# Patient Record
Sex: Female | Born: 1955 | Race: White | Hispanic: No | Smoking: Current every day smoker
Health system: Southern US, Community
[De-identification: ages and names within clinical notes are randomized; demographics above are authoritative.]

## PROBLEM LIST (undated history)

## (undated) DIAGNOSIS — R519 Headache, unspecified: Secondary | ICD-10-CM

## (undated) DIAGNOSIS — K219 Gastro-esophageal reflux disease without esophagitis: Secondary | ICD-10-CM

## (undated) DIAGNOSIS — J449 Chronic obstructive pulmonary disease, unspecified: Secondary | ICD-10-CM

## (undated) DIAGNOSIS — I10 Essential (primary) hypertension: Secondary | ICD-10-CM

## (undated) DIAGNOSIS — R51 Headache: Secondary | ICD-10-CM

## (undated) HISTORY — PX: FOOT SURGERY: SHX648

## (undated) HISTORY — PX: ABDOMINAL HYSTERECTOMY: SHX81

## (undated) HISTORY — PX: APPENDECTOMY: SHX54

## (undated) HISTORY — PX: OOPHORECTOMY: SHX86

## (undated) HISTORY — PX: TUBAL LIGATION: SHX77

## (undated) HISTORY — PX: BREAST SURGERY: SHX581

## (undated) HISTORY — PX: KNEE ARTHROSCOPY: SUR90

## (undated) HISTORY — PX: BREAST CYST EXCISION: SHX579

---

## 2004-02-12 ENCOUNTER — Emergency Department: Payer: Self-pay | Admitting: General Practice

## 2005-02-21 ENCOUNTER — Ambulatory Visit: Payer: Self-pay | Admitting: Gastroenterology

## 2005-05-15 ENCOUNTER — Ambulatory Visit: Payer: Self-pay | Admitting: Internal Medicine

## 2005-05-19 ENCOUNTER — Ambulatory Visit: Payer: Self-pay | Admitting: Internal Medicine

## 2005-07-20 ENCOUNTER — Ambulatory Visit: Payer: Self-pay | Admitting: Specialist

## 2005-09-30 ENCOUNTER — Emergency Department: Payer: Self-pay

## 2005-12-06 ENCOUNTER — Ambulatory Visit: Payer: Self-pay | Admitting: Internal Medicine

## 2006-02-15 DIAGNOSIS — I10 Essential (primary) hypertension: Secondary | ICD-10-CM | POA: Insufficient documentation

## 2006-02-15 DIAGNOSIS — K2100 Gastro-esophageal reflux disease with esophagitis, without bleeding: Secondary | ICD-10-CM | POA: Insufficient documentation

## 2006-04-18 ENCOUNTER — Ambulatory Visit: Payer: Self-pay | Admitting: Gastroenterology

## 2006-11-07 ENCOUNTER — Ambulatory Visit: Payer: Self-pay | Admitting: Family Medicine

## 2007-05-08 ENCOUNTER — Ambulatory Visit: Payer: Self-pay | Admitting: Gastroenterology

## 2007-12-31 ENCOUNTER — Emergency Department: Payer: Self-pay | Admitting: Emergency Medicine

## 2008-01-16 ENCOUNTER — Ambulatory Visit: Payer: Self-pay | Admitting: Internal Medicine

## 2008-06-11 ENCOUNTER — Ambulatory Visit: Payer: Self-pay | Admitting: Internal Medicine

## 2008-07-22 ENCOUNTER — Ambulatory Visit: Payer: Self-pay | Admitting: Gastroenterology

## 2008-09-25 ENCOUNTER — Ambulatory Visit: Payer: Self-pay | Admitting: Internal Medicine

## 2008-10-13 ENCOUNTER — Ambulatory Visit (HOSPITAL_COMMUNITY): Admission: RE | Admit: 2008-10-13 | Discharge: 2008-10-13 | Payer: Self-pay | Admitting: Neurosurgery

## 2008-10-13 ENCOUNTER — Encounter: Admission: RE | Admit: 2008-10-13 | Discharge: 2008-10-13 | Payer: Self-pay | Admitting: Neurosurgery

## 2009-01-08 ENCOUNTER — Emergency Department: Payer: Self-pay | Admitting: Emergency Medicine

## 2009-01-11 ENCOUNTER — Encounter: Admission: RE | Admit: 2009-01-11 | Discharge: 2009-01-11 | Payer: Self-pay | Admitting: Neurosurgery

## 2009-01-19 ENCOUNTER — Ambulatory Visit: Payer: Self-pay | Admitting: Internal Medicine

## 2009-09-23 ENCOUNTER — Ambulatory Visit: Payer: Self-pay | Admitting: Allergy

## 2009-10-19 ENCOUNTER — Ambulatory Visit: Payer: Self-pay | Admitting: Surgery

## 2009-11-02 ENCOUNTER — Ambulatory Visit: Payer: Self-pay | Admitting: Surgery

## 2010-01-20 ENCOUNTER — Ambulatory Visit: Payer: Self-pay | Admitting: Internal Medicine

## 2010-01-23 ENCOUNTER — Encounter: Payer: Self-pay | Admitting: Neurosurgery

## 2010-04-07 LAB — CBC
MCHC: 34.6 g/dL (ref 30.0–36.0)
MCV: 99.8 fL (ref 78.0–100.0)
Platelets: 234 10*3/uL (ref 150–400)
RBC: 3.88 MIL/uL (ref 3.87–5.11)
RDW: 14.4 % (ref 11.5–15.5)

## 2010-04-07 LAB — BASIC METABOLIC PANEL
BUN: 14 mg/dL (ref 6–23)
Calcium: 9.2 mg/dL (ref 8.4–10.5)

## 2010-04-07 LAB — ABO/RH: ABO/RH(D): O NEG

## 2010-04-11 ENCOUNTER — Ambulatory Visit: Payer: Self-pay | Admitting: Ophthalmology

## 2011-06-20 ENCOUNTER — Ambulatory Visit: Payer: Self-pay | Admitting: Internal Medicine

## 2012-01-11 ENCOUNTER — Ambulatory Visit: Payer: Self-pay | Admitting: Ophthalmology

## 2012-01-22 ENCOUNTER — Ambulatory Visit: Payer: Self-pay | Admitting: Ophthalmology

## 2013-01-23 ENCOUNTER — Ambulatory Visit: Payer: Self-pay | Admitting: Obstetrics and Gynecology

## 2013-07-08 DIAGNOSIS — K219 Gastro-esophageal reflux disease without esophagitis: Secondary | ICD-10-CM | POA: Insufficient documentation

## 2013-07-08 DIAGNOSIS — G43909 Migraine, unspecified, not intractable, without status migrainosus: Secondary | ICD-10-CM | POA: Insufficient documentation

## 2013-07-08 DIAGNOSIS — E7800 Pure hypercholesterolemia, unspecified: Secondary | ICD-10-CM | POA: Insufficient documentation

## 2013-12-05 ENCOUNTER — Emergency Department: Payer: Self-pay | Admitting: Emergency Medicine

## 2014-01-29 ENCOUNTER — Ambulatory Visit: Payer: Self-pay | Admitting: Obstetrics and Gynecology

## 2014-04-24 NOTE — Op Note (Signed)
PATIENT NAME:  Leah Riley, Leah Riley MR#:  [id number] DATE OF BIRTH:  [birth year]  DATE OF PROCEDURE:  01/22/2012  PREOPERATIVE DIAGNOSIS:  Cataract, right eye.   POSTOPERATIVE DIAGNOSIS:  Cataract, right eye.  PROCEDURE PERFORMED:  Extracapsular cataract extraction using phacoemulsification with placement of an Alcon [id number] 21.0-diopter posterior chamber lens, serial #[id number].  SURGEON:  Maylon PeppersSteven Riley. Johanny Segers, MD  ASSISTANT:  None.  ANESTHESIA:  4% lidocaine and 0.75% Marcaine in Riley 50/50 mixture with 10 units/mL of Hylenex added, given as Riley peribulbar.  ANESTHESIOLOGIST:  Margot AblesG. Van Staveren, MD   COMPLICATIONS:  None.  ESTIMATED BLOOD LOSS:  Less than 1 mL.  DESCRIPTION OF PROCEDURE:  The patient was brought to the operating room and given Riley peribulbar block.  The patient was then prepped and draped in the usual fashion.  The vertical rectus muscles were imbricated using 5-0 silk sutures.  These sutures were then clamped to the sterile drapes as bridle sutures.  Riley limbal peritomy was performed extending two clock hours and hemostasis was obtained with cautery.  Riley partial thickness scleral groove was made at the surgical limbus and dissected anteriorly in Riley lamellar dissection using an Alcon crescent knife.  The anterior chamber was entered superonasally with Riley Superblade and through the lamellar dissection with Riley 2.6 mm keratome.  DisCoVisc was used to replace the aqueous and Riley continuous tear capsulorrhexis was carried out.  Hydrodissection and hydrodelineation were carried out with balanced salt and Riley 27 gauge canula.  The nucleus was rotated to confirm the effectiveness of the hydrodissection.  Phacoemulsification was carried out using Riley divide-and-conquer technique.  Total ultrasound time was 42 seconds with an average power of 4.2 percent, CDE 14.37.  Irrigation/aspiration was used to remove the residual cortex.  DisCoVisc was used to inflate the capsule and the internal incision was enlarged  to 3 mm with the crescent knife.  The intraocular lens was folded and inserted into the capsular bag using the AcrySert Delivery System.   Irrigation/aspiration was used to remove the residual DisCoVisc.  Miostat was injected into the anterior chamber through the paracentesis track to inflate the anterior chamber and induce miosis.  Cefuroxime, 0.1 mL, was injected via the paracentesis tract. The wound was checked for leaks and none were found. The conjunctiva was closed with cautery and the bridle sutures were removed.  Two drops of 0.3% Vigamox were placed on the eye.   An eye shield was placed on the eye.  The patient was discharged to the recovery room in good condition.  ____________________________ Maylon PeppersSteven Riley. Lavita Pontius, MD sad:cb D: 01/22/2012 13:43:59 ET T: 01/22/2012 14:29:02 ET JOB#: [id number]  cc: Viviann SpareSteven Riley. Maysel Mccolm, MD, <Dictator> Erline LevineSTEVEN Riley Riley Hallum MD ELECTRONICALLY SIGNED 01/29/2012 13:18

## 2015-02-11 DIAGNOSIS — J449 Chronic obstructive pulmonary disease, unspecified: Secondary | ICD-10-CM | POA: Insufficient documentation

## 2015-05-10 ENCOUNTER — Other Ambulatory Visit: Payer: Self-pay | Admitting: Orthopedic Surgery

## 2015-05-10 DIAGNOSIS — M75101 Unspecified rotator cuff tear or rupture of right shoulder, not specified as traumatic: Secondary | ICD-10-CM

## 2015-05-12 ENCOUNTER — Ambulatory Visit
Admission: RE | Admit: 2015-05-12 | Discharge: 2015-05-12 | Disposition: A | Discharge status: Home / Self Care | Payer: BLUE CROSS/BLUE SHIELD | Source: Ambulatory Visit | Attending: Orthopedic Surgery | Admitting: Orthopedic Surgery

## 2015-05-12 DIAGNOSIS — M75121 Complete rotator cuff tear or rupture of right shoulder, not specified as traumatic: Secondary | ICD-10-CM | POA: Diagnosis not present

## 2015-05-12 DIAGNOSIS — M75101 Unspecified rotator cuff tear or rupture of right shoulder, not specified as traumatic: Secondary | ICD-10-CM | POA: Diagnosis present

## 2015-05-12 DIAGNOSIS — M7521 Bicipital tendinitis, right shoulder: Secondary | ICD-10-CM | POA: Diagnosis not present

## 2015-05-18 ENCOUNTER — Other Ambulatory Visit: Payer: Self-pay | Admitting: Orthopedic Surgery

## 2015-05-18 DIAGNOSIS — M75101 Unspecified rotator cuff tear or rupture of right shoulder, not specified as traumatic: Secondary | ICD-10-CM

## 2015-05-20 ENCOUNTER — Ambulatory Visit: Payer: BLUE CROSS/BLUE SHIELD

## 2015-05-24 DIAGNOSIS — M75121 Complete rotator cuff tear or rupture of right shoulder, not specified as traumatic: Secondary | ICD-10-CM | POA: Insufficient documentation

## 2015-05-25 ENCOUNTER — Encounter: Payer: Self-pay | Admitting: *Deleted

## 2015-05-26 ENCOUNTER — Ambulatory Visit
Admission: RE | Admit: 2015-05-26 | Discharge: 2015-05-26 | Disposition: A | Discharge status: Home / Self Care | Payer: BLUE CROSS/BLUE SHIELD | Source: Ambulatory Visit | Attending: Surgery | Admitting: Surgery

## 2015-05-26 ENCOUNTER — Encounter
Admission: RE | Disposition: A | Discharge status: Home / Self Care | Payer: Self-pay | Source: Ambulatory Visit | Attending: Surgery

## 2015-05-26 ENCOUNTER — Ambulatory Visit: Payer: BLUE CROSS/BLUE SHIELD | Admitting: Anesthesiology

## 2015-05-26 DIAGNOSIS — F17200 Nicotine dependence, unspecified, uncomplicated: Secondary | ICD-10-CM | POA: Diagnosis not present

## 2015-05-26 DIAGNOSIS — J449 Chronic obstructive pulmonary disease, unspecified: Secondary | ICD-10-CM | POA: Insufficient documentation

## 2015-05-26 DIAGNOSIS — E7800 Pure hypercholesterolemia, unspecified: Secondary | ICD-10-CM | POA: Diagnosis not present

## 2015-05-26 DIAGNOSIS — Z8249 Family history of ischemic heart disease and other diseases of the circulatory system: Secondary | ICD-10-CM | POA: Insufficient documentation

## 2015-05-26 DIAGNOSIS — K219 Gastro-esophageal reflux disease without esophagitis: Secondary | ICD-10-CM | POA: Diagnosis not present

## 2015-05-26 DIAGNOSIS — Z7982 Long term (current) use of aspirin: Secondary | ICD-10-CM | POA: Diagnosis not present

## 2015-05-26 DIAGNOSIS — Z882 Allergy status to sulfonamides status: Secondary | ICD-10-CM | POA: Diagnosis not present

## 2015-05-26 DIAGNOSIS — Z79899 Other long term (current) drug therapy: Secondary | ICD-10-CM | POA: Diagnosis not present

## 2015-05-26 DIAGNOSIS — Z90710 Acquired absence of both cervix and uterus: Secondary | ICD-10-CM | POA: Diagnosis not present

## 2015-05-26 DIAGNOSIS — Z833 Family history of diabetes mellitus: Secondary | ICD-10-CM | POA: Diagnosis not present

## 2015-05-26 DIAGNOSIS — K9041 Non-celiac gluten sensitivity: Secondary | ICD-10-CM | POA: Diagnosis not present

## 2015-05-26 DIAGNOSIS — M75101 Unspecified rotator cuff tear or rupture of right shoulder, not specified as traumatic: Secondary | ICD-10-CM | POA: Diagnosis present

## 2015-05-26 DIAGNOSIS — G43909 Migraine, unspecified, not intractable, without status migrainosus: Secondary | ICD-10-CM | POA: Insufficient documentation

## 2015-05-26 HISTORY — DX: Essential (primary) hypertension: I10

## 2015-05-26 HISTORY — PX: SHOULDER ARTHROSCOPY: SHX128

## 2015-05-26 HISTORY — DX: Gastro-esophageal reflux disease without esophagitis: K21.9

## 2015-05-26 HISTORY — DX: Headache: R51

## 2015-05-26 HISTORY — DX: Headache, unspecified: R51.9

## 2015-05-26 HISTORY — DX: Chronic obstructive pulmonary disease, unspecified: J44.9

## 2015-05-26 SURGERY — ARTHROSCOPY, SHOULDER
Anesthesia: Regional | Site: Shoulder | Laterality: Right | Wound class: Clean

## 2015-05-26 MED ORDER — POTASSIUM CHLORIDE IN NACL 20-0.9 MEQ/L-% IV SOLN: INTRAVENOUS | Status: DC

## 2015-05-26 MED ORDER — PROPOFOL 10 MG/ML IV BOLUS
INTRAVENOUS | Status: DC | PRN
  Administered 2015-05-26: 100 mg via INTRAVENOUS

## 2015-05-26 MED ORDER — OXYCODONE HCL 5 MG PO TABS: 5.0000 mg | ORAL_TABLET | ORAL | Status: DC | PRN

## 2015-05-26 MED ORDER — LACTATED RINGERS IV SOLN
INTRAVENOUS | Status: DC
  Administered 2015-05-26: 13:00:00 via INTRAVENOUS

## 2015-05-26 MED ORDER — LIDOCAINE HCL (CARDIAC) 20 MG/ML IV SOLN
INTRAVENOUS | Status: DC | PRN
  Administered 2015-05-26: 30 mg via INTRATRACHEAL

## 2015-05-26 MED ORDER — MIDAZOLAM HCL 5 MG/5ML IJ SOLN
INTRAMUSCULAR | Status: DC | PRN
  Administered 2015-05-26: 2 mg via INTRAVENOUS

## 2015-05-26 MED ORDER — EPHEDRINE SULFATE 50 MG/ML IJ SOLN
INTRAMUSCULAR | Status: DC | PRN
  Administered 2015-05-26 (×4): 5 mg via INTRAVENOUS

## 2015-05-26 MED ORDER — ONDANSETRON HCL 4 MG/2ML IJ SOLN: 4.0000 mg | Freq: Four times a day (QID) | INTRAMUSCULAR | Status: DC | PRN

## 2015-05-26 MED ORDER — OXYCODONE HCL 5 MG/5ML PO SOLN: 5.0000 mg | Freq: Once | ORAL | Status: DC | PRN

## 2015-05-26 MED ORDER — FENTANYL CITRATE (PF) 100 MCG/2ML IJ SOLN
INTRAMUSCULAR | Status: DC | PRN
  Administered 2015-05-26: 100 ug via INTRAVENOUS

## 2015-05-26 MED ORDER — ONDANSETRON HCL 4 MG/2ML IJ SOLN
INTRAMUSCULAR | Status: DC | PRN
  Administered 2015-05-26: 4 mg via INTRAVENOUS

## 2015-05-26 MED ORDER — METOCLOPRAMIDE HCL 5 MG/ML IJ SOLN: 5.0000 mg | Freq: Three times a day (TID) | INTRAMUSCULAR | Status: DC | PRN

## 2015-05-26 MED ORDER — METOCLOPRAMIDE HCL 5 MG PO TABS: 5.0000 mg | ORAL_TABLET | Freq: Three times a day (TID) | ORAL | Status: DC | PRN

## 2015-05-26 MED ORDER — PROMETHAZINE HCL 25 MG/ML IJ SOLN: 6.2500 mg | INTRAMUSCULAR | Status: DC | PRN

## 2015-05-26 MED ORDER — FENTANYL CITRATE (PF) 100 MCG/2ML IJ SOLN: 25.0000 ug | INTRAMUSCULAR | Status: DC | PRN

## 2015-05-26 MED ORDER — CEFAZOLIN SODIUM-DEXTROSE 2-4 GM/100ML-% IV SOLN
2.0000 g | Freq: Once | INTRAVENOUS | Status: AC
  Administered 2015-05-26: 2 g via INTRAVENOUS

## 2015-05-26 MED ORDER — DEXAMETHASONE SODIUM PHOSPHATE 4 MG/ML IJ SOLN
INTRAMUSCULAR | Status: DC | PRN
  Administered 2015-05-26: 8 mg via INTRAVENOUS

## 2015-05-26 MED ORDER — ONDANSETRON HCL 4 MG PO TABS: 4.0000 mg | ORAL_TABLET | Freq: Four times a day (QID) | ORAL | Status: DC | PRN

## 2015-05-26 MED ORDER — BUPIVACAINE-EPINEPHRINE (PF) 0.5% -1:200000 IJ SOLN
INTRAMUSCULAR | Status: DC | PRN
  Administered 2015-05-26: 30 mL

## 2015-05-26 MED ORDER — OXYCODONE HCL 5 MG PO TABS: 5.0000 mg | ORAL_TABLET | Freq: Once | ORAL | Status: DC | PRN

## 2015-05-26 SURGICAL SUPPLY — 38 items
ANCHOR JUGGERKNOT WTAP NDL 2.9 (Anchor) ×6 IMPLANT
BIT DRILL JUGRKNT W/NDL BIT2.9 (DRILL) IMPLANT
BLADE FULL RADIUS 3.5 (BLADE) ×3 IMPLANT
BUR ACROMIONIZER 4.0 (BURR) ×3 IMPLANT
CANNULA SHAVER 8MMX76MM (CANNULA) ×3 IMPLANT
CHLORAPREP W/TINT 26ML (MISCELLANEOUS) ×3 IMPLANT
COVER LIGHT HANDLE UNIVERSAL (MISCELLANEOUS) ×6 IMPLANT
COVER MAYO STAND STRL (DRAPES) ×3 IMPLANT
DRAPE IMP U-DRAPE 54X76 (DRAPES) ×6 IMPLANT
DRILL JUGGERKNOT W/NDL BIT 2.9 (DRILL)
GAUZE PETRO XEROFOAM 1X8 (MISCELLANEOUS) ×3 IMPLANT
GAUZE SPONGE 4X4 12PLY STRL (GAUZE/BANDAGES/DRESSINGS) ×3 IMPLANT
GLOVE BIO SURGEON STRL SZ8 (GLOVE) ×6 IMPLANT
GLOVE INDICATOR 8.0 STRL GRN (GLOVE) ×3 IMPLANT
GOWN STRL REUS W/ TWL LRG LVL3 (GOWN DISPOSABLE) ×1 IMPLANT
GOWN STRL REUS W/ TWL XL LVL3 (GOWN DISPOSABLE) ×1 IMPLANT
GOWN STRL REUS W/TWL LRG LVL3 (GOWN DISPOSABLE) ×2
GOWN STRL REUS W/TWL XL LVL3 (GOWN DISPOSABLE) ×2
IV LACTATED RINGER IRRG 3000ML (IV SOLUTION) ×4
IV LR IRRIG 3000ML ARTHROMATIC (IV SOLUTION) ×2 IMPLANT
KIT ROOM TURNOVER OR (KITS) ×3 IMPLANT
MANIFOLD 4PT FOR NEPTUNE1 (MISCELLANEOUS) ×3 IMPLANT
MARTIN UTERINE NEEDLE ×3 IMPLANT
MAT BLUE FLOOR 46X72 FLO (MISCELLANEOUS) ×3 IMPLANT
NEEDLE HYPO 21X1.5 SAFETY (NEEDLE) ×3 IMPLANT
PACK ARTHROSCOPY SHOULDER (MISCELLANEOUS) ×3 IMPLANT
PAD GROUND ADULT SPLIT (MISCELLANEOUS) IMPLANT
SLING ULTRA II M (MISCELLANEOUS) ×3 IMPLANT
STAPLER SKIN PROX 35W (STAPLE) ×3 IMPLANT
STRAP BODY AND KNEE 60X3 (MISCELLANEOUS) ×6 IMPLANT
SUT ETHIBOND 0 MO6 C/R (SUTURE) ×3 IMPLANT
SUT VIC AB 2-0 CT1 27 (SUTURE) ×4
SUT VIC AB 2-0 CT1 TAPERPNT 27 (SUTURE) ×2 IMPLANT
TAPE MICROFOAM 4IN (TAPE) ×3 IMPLANT
TUBING ARTHRO INFLOW-ONLY STRL (TUBING) ×3 IMPLANT
TUBING CONNECTING 10 (TUBING) ×2 IMPLANT
TUBING CONNECTING 10' (TUBING) ×1
WAND HAND CNTRL MULTIVAC 90 (MISCELLANEOUS) ×3 IMPLANT

## 2015-05-26 NOTE — Anesthesia Postprocedure Evaluation (Signed)
Anesthesia Post Note  Patient: Lanora A Shenk  Procedure(s) Performed: Procedure(s) (LRB): SHOULDER RIGHT LIMITED ARTHROSCOPIC DEBRIDEMENT ARTHROSCOPIC SUBACROMIAL DECOMPRESSION MINI OPEN ROTATOR CUFF REPAIR MINI OPEN BICEPS TENODESIS (Right)  Patient location during evaluation: PACU Anesthesia Type: General and Regional Level of consciousness: awake and alert Pain management: pain level controlled Vital Signs Assessment: post-procedure vital signs reviewed and stable Respiratory status: spontaneous breathing, nonlabored ventilation, respiratory function stable and patient connected to nasal cannula oxygen Cardiovascular status: blood pressure returned to baseline and stable Postop Assessment: no signs of nausea or vomiting Anesthetic complications: no    Angellica Maddison C

## 2015-05-26 NOTE — Discharge Instructions (Signed)
General Anesthesia, Adult, Care After °Refer to this sheet in the next few weeks. These instructions provide you with information on caring for yourself after your procedure. Your health care provider may also give you more specific instructions. Your treatment has been planned according to current medical practices, but problems sometimes occur. Call your health care provider if you have any problems or questions after your procedure. °WHAT TO EXPECT AFTER THE PROCEDURE °After the procedure, it is typical to experience: °· Sleepiness. °· Nausea and vomiting. °HOME CARE INSTRUCTIONS °· For the first 24 hours after general anesthesia: °¨ Have a responsible person with you. °¨ Do not drive a car. If you are alone, do not take public transportation. °¨ Do not drink alcohol. °¨ Do not take medicine that has not been prescribed by your health care provider. °¨ Do not sign important papers or make important decisions. °¨ You may resume a normal diet and activities as directed by your health care provider. °· Change bandages (dressings) as directed. °· If you have questions or problems that seem related to general anesthesia, call the hospital and ask for the anesthetist or anesthesiologist on call. °SEEK MEDICAL CARE IF: °· You have nausea and vomiting that continue the day after anesthesia. °· You develop a rash. °SEEK IMMEDIATE MEDICAL CARE IF:  °· You have difficulty breathing. °· You have chest pain. °· You have any allergic problems. °  °This information is not intended to replace advice given to you by your health care provider. Make sure you discuss any questions you have with your health care provider. °  °Document Released: 03/27/2000 Document Revised: 01/09/2014 Document Reviewed: 04/19/2011 °Elsevier Interactive Patient Education ©2016 [employer] ° °Keep dressing dry and intact.  °May shower after dressing changed on post-op day #4 (Sunday).  °Cover staples with Band-Aids after drying off. °Apply ice  frequently to shoulder. °Keep shoulder immobilizer on at all times except may remove for bathing purposes. °Follow-up in 10-14 days or as scheduled. °

## 2015-05-26 NOTE — Progress Notes (Addendum)
Assisted Christopher Gratian ANMD with right, ultrasound guided, interscalene  block. Side rails up, monitors on throughout procedure. See vital signs in flow sheet. Tolerated Procedure well.  

## 2015-05-26 NOTE — Op Note (Signed)
05/26/2015  2:41 PM  Patient:   Leah Riley  Pre-Op Diagnosis:   Full-thickness rotator cuff tear, right shoulder.  Postoperative diagnosis: Full-thickness rotator cuff tear with Type I SLAP tear and biceps tendinopathy, right shoulder.  Procedure: Limited arthroscopic debridement, arthroscopic subacromial decompression, mini-open rotator cuff repair, and mini-open biceps tenodesis, right shoulder.  Anesthesia: General LMA with interscalene block placed preoperatively by the anesthesiologist.  Surgeon:   Maryagnes Amos, MD  Assistant:   None  Findings: As above. It was a full-thickness tear of the anterior insertional fibers of the supraspinatus tendon measuring approximately 1.5 x 2 cm. The remaining portions of the rotator cuff all were in satisfactory condition. There was moderate fraying of the biceps tendon involving approximately 15-20% of the thickness of the tendon. The articular surfaces of the glenoid and humerus both were in satisfactory condition.  Complications: None  Fluids:   800 cc  Estimated blood loss: 5 cc  Tourniquet time: None  Drains: None  Closure: Staples   Brief clinical note: The patient is a 60 year old female with right shoulder pain. The patient's symptoms have progressed despite medications, activity modification, etc. The patient's history and examination are consistent with impingement/tendinopathy with a rotator cuff tear. These findings were confirmed by MRI scan. The patient presents at this time for definitive management of these shoulder symptoms.  Procedure: The patient underwent placement of an interscalene block by the anesthesiologist in the preoperative holding area before she was brought into the operating room and lain in the supine position. The patient then underwent general laryngeal mask anesthesia before being repositioned in the beach chair position using the beach chair positioner. The right shoulder and  upper extremity were prepped with ChloraPrep solution before being draped sterilely. Preoperative antibiotics were administered. A timeout was performed to confirm the proper surgical site before the expected portal sites and incision site were injected with 0.5% Sensorcaine with epinephrine. A posterior portal was created and the glenohumeral joint thoroughly inspected with the findings as described above. An anterior portal was created using an outside-in technique. The labrum and rotator cuff were further probed, again confirming the above-noted findings. The areas of labral fraying were debrided back to stable margins using the full-radius resector, as were the frayed margins of the rotator cuff tear. Areas of synovitis also were debrided using the full-radius resector. The biceps tendon also was carefully inspected. Given the amount of fraying noted and the fact that the rotator cuff tear abutted the biceps tendon, as felt best to proceed with a biceps tenodesis. Therefore, the biceps was released from its labral attachment using the ArthroCare wand, contouring the stump to the remaining labrum. The ArthroCare wand also was used to obtain hemostasis as well as to "anneal" the labrum superiorly and anteriorly. The instruments were removed from the joint after suctioning the excess fluid.  The camera was repositioned through the posterior portal into the subacromial space. A separate lateral portal was created using an outside-in technique. The 3.5 mm full-radius resector was introduced and used to perform a subtotal bursectomy. The ArthroCare wand was then inserted and used to remove the periosteal tissue off the undersurface of the anterior third of the acromion as well as to recess the coracoacromial ligament from its attachment along the anterior and lateral margins of the acromion. The 4.0 mm acromionizing bur was introduced and used to complete the decompression by removing the undersurface of the  anterior third of the acromion. The full radius resector was reintroduced  to remove any residual bony debris before the ArthroCare wand was reintroduced to obtain hemostasis. The instruments were then removed from the subacromial space after suctioning the excess fluid.  An approximately 4-5 cm incision was made over the anterolateral aspect of the shoulder beginning at the anterolateral corner of the acromion and extending distally in line with the bicipital groove. This incision was carried down through the subcutaneous tissues to expose the deltoid fascia. The raphae between the anterior and middle thirds was identified and this plane developed to provide access into the subacromial space. Additional bursal tissues were debrided sharply using Metzenbaum scissors. The rotator cuff tear was readily identified. The margins were debrided sharply with a #15 blade and the exposed greater tuberosity roughened with a rongeur. The tear was repaired using a single Biomet 2.9 mm JuggerKnot anchor. Several of these sutures were then brought back laterally through bone tunnels and tied over bone bridges to create a two-layer closure. An apparent watertight closure was obtained. Several #0 Ethibond interrupted sutures were used to reinforce the closure, as well as to reapproximate the longitudinal portion of the tear in a side-to-side fashion.  The bicipital groove was identified by palpation and opened for 1-1.5 cm. The biceps tendon stump was retrieved through this defect. The floor of the bicipital groove was roughened with a curet before another Biomet 2.9 mm JuggerKnot anchor was inserted. Both sets of sutures were passed through the biceps tendon and tied securely to effect the tenodesis. The bicipital sheath was reapproximated using two #0 Ethibond interrupted sutures, incorporating the biceps tendon to further reinforce the tenodesis.  The wound was copiously irrigated with sterile saline solution before the  deltoid raphae was reapproximated using 2-0 Vicryl interrupted sutures. The subcutaneous tissues were closed in two layers using 2-0 Vicryl interrupted sutures before the skin was closed using staples. The portal sites also were closed using staples. A sterile bulky dressing was applied to the shoulder before the arm was placed into a shoulder immobilizer. The patient was then awakened, extubated, and returned to the recovery room in satisfactory condition after tolerating the procedure well.

## 2015-05-26 NOTE — Anesthesia Procedure Notes (Addendum)
Anesthesia Regional Block:  Interscalene brachial plexus block  Pre-Anesthetic Checklist: ,, timeout performed, Correct Patient, Correct Site, Correct Laterality, Correct Procedure, Correct Position, site marked, Risks and benefits discussed,  Surgical consent,  Pre-op evaluation,  At surgeon's request and post-op pain management   Prep: chloraprep       Needles:  Injection technique: Single-shot  Needle Type: Stimiplex     Needle Length: 10cm 10 cm Needle Gauge: 21 and 21 G    Additional Needles:  Procedures: ultrasound guided (picture in chart) Interscalene brachial plexus block Narrative:  Start time: 05/26/2015 11:47 AM End time: 05/26/2015 12:02 PM Injection made incrementally with aspirations every 5 mL.  Performed by: Personally   Additional Notes: Functioning IV was confirmed and monitors applied. Ultrasound guidance: relevant anatomy identified, needle position confirmed, local anesthetic spread visualized around nerve(s)., vascular puncture avoided.  Image printed for medical record.  Negative aspiration and no paresthesias; incremental administration of local anesthetic. The patient tolerated the procedure well. Vitals signes recorded in RN notes. 30 ml of ropivicaine used.  Excellent image. No needle to nerve contact.  Nerves, needle, local and blood vessels all well visualized throughout.  No complications.   Procedure Name: LMA Insertion Date/Time: 05/26/2015 1:11 PM Performed by: Andee PolesBUSH, Hisayo Delossantos Pre-anesthesia Checklist: Patient identified, Emergency Drugs available, Suction available, Timeout performed and Patient being monitored Patient Re-evaluated:Patient Re-evaluated prior to inductionOxygen Delivery Method: Circle system utilized Preoxygenation: Pre-oxygenation with 100% oxygen Intubation Type: IV induction LMA: LMA inserted LMA Size: 4.0 Number of attempts: 1 Placement Confirmation: positive ETCO2 and breath sounds checked- equal and bilateral Tube secured  with: Tape

## 2015-05-26 NOTE — Anesthesia Preprocedure Evaluation (Addendum)
Anesthesia Evaluation  Patient identified by MRN, date of birth, ID band Patient awake    Reviewed: Allergy & Precautions, NPO status , Patient's Chart, lab work & pertinent test results  Airway Mallampati: II  TM Distance: >3 FB Neck ROM: Full    Dental no notable dental hx.    Pulmonary COPD, Current Smoker,  No home O2. Active at work at [employer]. Lifts heavy objects and walks around store without difficulty.   Pulmonary exam normal breath sounds clear to auscultation       Cardiovascular hypertension, negative cardio ROS Normal cardiovascular exam Rhythm:Regular Rate:Normal  Mets 4+   Neuro/Psych negative neurological ROS  negative psych ROS   GI/Hepatic negative GI ROS, Neg liver ROS, GERD  Medicated and Controlled,  Endo/Other  negative endocrine ROS  Renal/GU negative Renal ROS  negative genitourinary   Musculoskeletal negative musculoskeletal ROS (+)   Abdominal   Peds negative pediatric ROS (+)  Hematology negative hematology ROS (+)   Anesthesia Other Findings   Reproductive/Obstetrics negative OB ROS                            Anesthesia Physical Anesthesia Plan  ASA: II  Anesthesia Plan: General and Regional   Post-op Pain Management:    Induction: Intravenous  Airway Management Planned:   Additional Equipment:   Intra-op Plan:   Post-operative Plan: Extubation in OR  Informed Consent: I have reviewed the patients History and Physical, chart, labs and discussed the procedure including the risks, benefits and alternatives for the proposed anesthesia with the patient or authorized representative who has indicated his/her understanding and acceptance.   Dental advisory given  Plan Discussed with: CRNA  Anesthesia Plan Comments: (Pt with COPD.  Continue with interscalene block given: no home O2 requirement.  Excellent functional capacity.  Non labored breathing on  physical exam.)       Anesthesia Quick Evaluation

## 2015-05-26 NOTE — Transfer of Care (Signed)
Immediate Anesthesia Transfer of Care Note  Patient: Leah Riley  Procedure(s) Performed: Procedure(s): ARTHROSCOPY SHOULDER WITH DEBRIDEMENT,DECOMPRESSION MINI OPEN REPAIR OF ROTATOR CUFF TEAR RIGHT SHOULDER (Right)  Patient Location: PACU  Anesthesia Type: General, Regional  Level of Consciousness: awake, alert  and patient cooperative  Airway and Oxygen Therapy: Patient Spontanous Breathing and Patient connected to supplemental oxygen  Post-op Assessment: Post-op Vital signs reviewed, Patient's Cardiovascular Status Stable, Respiratory Function Stable, Patent Airway and No signs of Nausea or vomiting  Post-op Vital Signs: Reviewed and stable  Complications: No apparent anesthesia complications

## 2015-05-26 NOTE — H&P (Signed)
Paper H&P to be scanned into permanent record. H&P reviewed. No changes. 

## 2015-05-27 ENCOUNTER — Encounter: Payer: Self-pay | Admitting: Surgery

## 2015-06-01 ENCOUNTER — Encounter: Payer: Self-pay | Admitting: Surgery

## 2015-07-09 DIAGNOSIS — M25862 Other specified joint disorders, left knee: Secondary | ICD-10-CM | POA: Insufficient documentation

## 2015-07-13 ENCOUNTER — Other Ambulatory Visit: Payer: Self-pay | Admitting: Obstetrics and Gynecology

## 2015-07-13 DIAGNOSIS — Z1231 Encounter for screening mammogram for malignant neoplasm of breast: Secondary | ICD-10-CM

## 2015-07-16 ENCOUNTER — Ambulatory Visit
Admission: RE | Admit: 2015-07-16 | Discharge: 2015-07-16 | Disposition: A | Discharge status: Home / Self Care | Payer: BLUE CROSS/BLUE SHIELD | Source: Ambulatory Visit | Attending: Obstetrics and Gynecology | Admitting: Obstetrics and Gynecology

## 2015-07-16 DIAGNOSIS — Z1231 Encounter for screening mammogram for malignant neoplasm of breast: Secondary | ICD-10-CM | POA: Insufficient documentation

## 2015-07-20 ENCOUNTER — Other Ambulatory Visit: Payer: Self-pay | Admitting: Oncology

## 2015-07-23 ENCOUNTER — Encounter: Payer: Self-pay | Admitting: *Deleted

## 2015-07-28 ENCOUNTER — Encounter
Admission: RE | Disposition: A | Discharge status: Home / Self Care | Payer: Self-pay | Source: Ambulatory Visit | Attending: Surgery

## 2015-07-28 ENCOUNTER — Ambulatory Visit: Payer: BLUE CROSS/BLUE SHIELD | Admitting: Anesthesiology

## 2015-07-28 ENCOUNTER — Ambulatory Visit
Admission: RE | Admit: 2015-07-28 | Discharge: 2015-07-28 | Disposition: A | Discharge status: Home / Self Care | Payer: BLUE CROSS/BLUE SHIELD | Source: Ambulatory Visit | Attending: Surgery | Admitting: Surgery

## 2015-07-28 DIAGNOSIS — Z79899 Other long term (current) drug therapy: Secondary | ICD-10-CM | POA: Insufficient documentation

## 2015-07-28 DIAGNOSIS — K219 Gastro-esophageal reflux disease without esophagitis: Secondary | ICD-10-CM | POA: Diagnosis not present

## 2015-07-28 DIAGNOSIS — J449 Chronic obstructive pulmonary disease, unspecified: Secondary | ICD-10-CM | POA: Diagnosis not present

## 2015-07-28 DIAGNOSIS — E7800 Pure hypercholesterolemia, unspecified: Secondary | ICD-10-CM | POA: Insufficient documentation

## 2015-07-28 DIAGNOSIS — Z7982 Long term (current) use of aspirin: Secondary | ICD-10-CM | POA: Diagnosis not present

## 2015-07-28 DIAGNOSIS — I10 Essential (primary) hypertension: Secondary | ICD-10-CM | POA: Diagnosis not present

## 2015-07-28 DIAGNOSIS — F17200 Nicotine dependence, unspecified, uncomplicated: Secondary | ICD-10-CM | POA: Diagnosis not present

## 2015-07-28 DIAGNOSIS — Z882 Allergy status to sulfonamides status: Secondary | ICD-10-CM | POA: Insufficient documentation

## 2015-07-28 DIAGNOSIS — L729 Follicular cyst of the skin and subcutaneous tissue, unspecified: Secondary | ICD-10-CM | POA: Diagnosis present

## 2015-07-28 DIAGNOSIS — Z9102 Food additives allergy status: Secondary | ICD-10-CM | POA: Insufficient documentation

## 2015-07-28 DIAGNOSIS — M67462 Ganglion, left knee: Secondary | ICD-10-CM | POA: Diagnosis not present

## 2015-07-28 HISTORY — PX: CYST EXCISION: SHX5701

## 2015-07-28 SURGERY — CYST REMOVAL
Anesthesia: General | Laterality: Left | Wound class: Clean

## 2015-07-28 MED ORDER — MIDAZOLAM HCL 2 MG/2ML IJ SOLN
INTRAMUSCULAR | Status: DC | PRN
  Administered 2015-07-28: 2 mg via INTRAVENOUS

## 2015-07-28 MED ORDER — HYDROCODONE-ACETAMINOPHEN 5-325 MG PO TABS: 1.0000 | ORAL_TABLET | Freq: Four times a day (QID) | ORAL | 0 refills | Status: DC | PRN

## 2015-07-28 MED ORDER — LACTATED RINGERS IV SOLN
INTRAVENOUS | Status: DC
  Administered 2015-07-28: 11:00:00 via INTRAVENOUS

## 2015-07-28 MED ORDER — CEFAZOLIN SODIUM-DEXTROSE 2-4 GM/100ML-% IV SOLN
2.0000 g | Freq: Once | INTRAVENOUS | Status: AC
  Administered 2015-07-28: 2 g via INTRAVENOUS

## 2015-07-28 MED ORDER — LIDOCAINE HCL (CARDIAC) 20 MG/ML IV SOLN
INTRAVENOUS | Status: DC | PRN
  Administered 2015-07-28: 50 mg via INTRAVENOUS

## 2015-07-28 MED ORDER — FENTANYL CITRATE (PF) 100 MCG/2ML IJ SOLN
INTRAMUSCULAR | Status: DC | PRN
  Administered 2015-07-28: 100 ug via INTRAVENOUS

## 2015-07-28 MED ORDER — PROPOFOL 500 MG/50ML IV EMUL
INTRAVENOUS | Status: DC | PRN
  Administered 2015-07-28: 50 ug/kg/min via INTRAVENOUS

## 2015-07-28 MED ORDER — BUPIVACAINE-EPINEPHRINE (PF) 0.5% -1:200000 IJ SOLN
INTRAMUSCULAR | Status: DC | PRN
  Administered 2015-07-28: 17 mL

## 2015-07-28 SURGICAL SUPPLY — 33 items
BANDAGE ELASTIC 2 VELCRO NS LF (GAUZE/BANDAGES/DRESSINGS) IMPLANT
BANDAGE ELASTIC 3 VELCRO NS (GAUZE/BANDAGES/DRESSINGS) IMPLANT
BANDAGE ELASTIC 4 VELCRO NS (GAUZE/BANDAGES/DRESSINGS) IMPLANT
BANDAGE ELASTIC 6 VELCRO NS (GAUZE/BANDAGES/DRESSINGS) IMPLANT
BNDG COHESIVE 4X5 TAN STRL (GAUZE/BANDAGES/DRESSINGS) ×3 IMPLANT
BNDG ESMARK 4X12 TAN STRL LF (GAUZE/BANDAGES/DRESSINGS) IMPLANT
BNDG ESMARK 6X12 TAN STRL LF (GAUZE/BANDAGES/DRESSINGS) IMPLANT
CANISTER SUCT 1200ML W/VALVE (MISCELLANEOUS) ×3 IMPLANT
CHLORAPREP W/TINT 26ML (MISCELLANEOUS) ×3 IMPLANT
CORD BIP STRL DISP 12FT (MISCELLANEOUS) IMPLANT
COVER LIGHT HANDLE UNIVERSAL (MISCELLANEOUS) ×6 IMPLANT
CUFF TOURN SGL QUICK 18 (TOURNIQUET CUFF) IMPLANT
CUFF TOURN SGL QUICK 24 (TOURNIQUET CUFF) ×2
CUFF TRNQT CYL 24X4X40X1 (TOURNIQUET CUFF) ×1 IMPLANT
GAUZE PETRO XEROFOAM 1X8 (MISCELLANEOUS) ×3 IMPLANT
GAUZE SPONGE 4X4 12PLY STRL (GAUZE/BANDAGES/DRESSINGS) ×3 IMPLANT
GLOVE BIO SURGEON STRL SZ8 (GLOVE) ×3 IMPLANT
GLOVE INDICATOR 8.0 STRL GRN (GLOVE) ×3 IMPLANT
GOWN STRL REUS W/ TWL LRG LVL3 (GOWN DISPOSABLE) ×1 IMPLANT
GOWN STRL REUS W/ TWL XL LVL3 (GOWN DISPOSABLE) ×1 IMPLANT
GOWN STRL REUS W/TWL LRG LVL3 (GOWN DISPOSABLE) ×2
GOWN STRL REUS W/TWL XL LVL3 (GOWN DISPOSABLE) ×2
KIT ROOM TURNOVER OR (KITS) ×3 IMPLANT
NEEDLE HYPO 21X1.5 SAFETY (NEEDLE) IMPLANT
NS IRRIG 500ML POUR BTL (IV SOLUTION) ×3 IMPLANT
PACK EXTREMITY ARMC (MISCELLANEOUS) ×3 IMPLANT
PAD GROUND ADULT SPLIT (MISCELLANEOUS) ×3 IMPLANT
STOCKINETTE IMPERVIOUS LG (DRAPES) ×3 IMPLANT
STRAP BODY AND KNEE 60X3 (MISCELLANEOUS) ×3 IMPLANT
SUT PROLENE 4 0 PS 2 18 (SUTURE) IMPLANT
SUT VIC AB 3-0 SH 27 (SUTURE) ×2
SUT VIC AB 3-0 SH 27X BRD (SUTURE) ×1 IMPLANT
SYR 20CC LL (SYRINGE) IMPLANT

## 2015-07-28 NOTE — Anesthesia Procedure Notes (Signed)
Procedure Name: MAC Performed by: Navaeh Kehres Pre-anesthesia Checklist: Patient identified, Emergency Drugs available, Suction available, Timeout performed and Patient being monitored Patient Re-evaluated:Patient Re-evaluated prior to inductionOxygen Delivery Method: Nasal cannula Placement Confirmation: positive ETCO2     

## 2015-07-28 NOTE — H&P (Signed)
Paper H&P to be scanned into permanent record. H&P reviewed. No changes. 

## 2015-07-28 NOTE — Op Note (Signed)
07/28/2015  11:53 AM  Patient:   Leah Riley  Pre-Op Diagnosis:   Subcutaneous cyst anterior left knee.  Post-Op Diagnosis:   Same.  Procedure:   Excision of subcutaneous cyst, left knee.  Surgeon:   Maryagnes Amos, MD  Anesthesia:   Local with IV sedation  Findings:   As above.  Complications:   None  EBL:   1 cc  Fluids:   650 cc crystalloid  TT:   None  Drains:   None  Closure:   3-0 Vicryl subcuticular sutures  Brief Clinical Note:   The patient is a 60 year old female with a several month history of a fluid filled soft tissue mass on the anterolateral aspect of her left knee. The patient does not recall any specific injury to have precipitated the cyst. The cyst has reappeared despite an aspiration/injection, so the patient presents at this time for excision of the subcutaneous cyst on the anterior aspect of her left knee.  Procedure:   The patient was brought into the operating room and lain in the supine position. A timeout was performed to verify the appropriate surgical site. After adequate IV sedation was achieved, the left lower extremity was prepped with ChloraPrep solution before being draped sterilely. Preoperative antibiotics were administered. An approximate 1.5-2 cm incision was made transversely over the cyst in line with the skin creases. The incision was carried down through the subcutaneous tissues with care taken to identify and protect any neurovascular structures. Once the cyst was identified, it was dissected out circumferentially before it was removed. During its removal, the cyst ruptured, releasing approximately 1.5 cc of clear fluid, consistent with a ganglion cyst.   The wound was copiously irrigated with sterile saline solution before the skin was closed using 3-0 Vicryl subcuticular sutures. Benzoin and Steri-Strips were applied to the skin before a sterile bulky dressing was applied to the knee. The patient was then awakened and returned to the  recovery room in satisfactory condition after tolerating the procedure well.

## 2015-07-28 NOTE — Anesthesia Preprocedure Evaluation (Addendum)
Anesthesia Evaluation  Patient identified by MRN, date of birth, ID band Patient awake    Reviewed: Allergy & Precautions, NPO status , Patient's Chart, lab work & pertinent test results  Airway Mallampati: II  TM Distance: >3 FB Neck ROM: Full    Dental no notable dental hx.    Pulmonary COPD, Current Smoker,  NO home oxygen   Pulmonary exam normal breath sounds clear to auscultation       Cardiovascular hypertension, Normal cardiovascular exam Rhythm:Regular Rate:Normal  Mets 4+   Neuro/Psych negative neurological ROS  negative psych ROS   GI/Hepatic negative GI ROS, Neg liver ROS, neg GERD  ,  Endo/Other  negative endocrine ROS  Renal/GU negative Renal ROS  negative genitourinary   Musculoskeletal negative musculoskeletal ROS (+)   Abdominal   Peds negative pediatric ROS (+)  Hematology negative hematology ROS (+)   Anesthesia Other Findings   Reproductive/Obstetrics negative OB ROS                             Anesthesia Physical Anesthesia Plan  ASA: III  Anesthesia Plan: MAC   Post-op Pain Management:    Induction: Intravenous  Airway Management Planned: LMA  Additional Equipment:   Intra-op Plan:   Post-operative Plan: Extubation in OR  Informed Consent: I have reviewed the patients History and Physical, chart, labs and discussed the procedure including the risks, benefits and alternatives for the proposed anesthesia with the patient or authorized representative who has indicated his/her understanding and acceptance.   Dental advisory given  Plan Discussed with: CRNA  Anesthesia Plan Comments:        Anesthesia Quick Evaluation

## 2015-07-28 NOTE — Discharge Instructions (Signed)
General Anesthesia, Adult, Care After °Refer to this sheet in the next few weeks. These instructions provide you with information on caring for yourself after your procedure. Your health care provider may also give you more specific instructions. Your treatment has been planned according to current medical practices, but problems sometimes occur. Call your health care provider if you have any problems or questions after your procedure. °WHAT TO EXPECT AFTER THE PROCEDURE °After the procedure, it is typical to experience: °· Sleepiness. °· Nausea and vomiting. °HOME CARE INSTRUCTIONS °· For the first 24 hours after general anesthesia: °¨ Have a responsible person with you. °¨ Do not drive a car. If you are alone, do not take public transportation. °¨ Do not drink alcohol. °¨ Do not take medicine that has not been prescribed by your health care provider. °¨ Do not sign important papers or make important decisions. °¨ You may resume a normal diet and activities as directed by your health care provider. °· Change bandages (dressings) as directed. °· If you have questions or problems that seem related to general anesthesia, call the hospital and ask for the anesthetist or anesthesiologist on call. °SEEK MEDICAL CARE IF: °· You have nausea and vomiting that continue the day after anesthesia. °· You develop a rash. °SEEK IMMEDIATE MEDICAL CARE IF:  °· You have difficulty breathing. °· You have chest pain. °· You have any allergic problems. °  °This information is not intended to replace advice given to you by your health care provider. Make sure you discuss any questions you have with your health care provider. °  °Document Released: 03/27/2000 Document Revised: 01/09/2014 Document Reviewed: 04/19/2011 °Elsevier Interactive Patient Education ©2016 [employer] ° °Keep dressing dry and intact.  °May shower after dressing changed on post-op day #4 (Sunday).  °Cover sutures with Band-Aids after drying off. °Apply ice  frequently to knee. °May weight-bear as tolerated - use crutches or walker as needed. °Follow-up in 10-14 days or as scheduled. °

## 2015-07-28 NOTE — Transfer of Care (Signed)
Immediate Anesthesia Transfer of Care Note  Patient: Leah Riley  Procedure(s) Performed: Procedure(s): CYST REMOVAL OF SUBCUTANEOUS LEFT KNEE (Left)  Patient Location: PACU  Anesthesia Type: General  Level of Consciousness: awake, alert  and patient cooperative  Airway and Oxygen Therapy: Patient Spontanous Breathing and Patient connected to supplemental oxygen  Post-op Assessment: Post-op Vital signs reviewed, Patient's Cardiovascular Status Stable, Respiratory Function Stable, Patent Airway and No signs of Nausea or vomiting  Post-op Vital Signs: Reviewed and stable  Complications: No apparent anesthesia complications

## 2015-07-28 NOTE — Anesthesia Postprocedure Evaluation (Signed)
Anesthesia Post Note  Patient: Leah Riley  Procedure(s) Performed: Procedure(s) (LRB): CYST REMOVAL OF SUBCUTANEOUS LEFT KNEE (Left)  Patient location during evaluation: PACU Anesthesia Type: MAC Level of consciousness: awake and alert Pain management: pain level controlled Vital Signs Assessment: post-procedure vital signs reviewed and stable Respiratory status: spontaneous breathing, nonlabored ventilation, respiratory function stable and patient connected to nasal cannula oxygen Cardiovascular status: stable and blood pressure returned to baseline Anesthetic complications: no    Delrick Dehart C

## 2015-07-29 ENCOUNTER — Encounter: Payer: Self-pay | Admitting: Surgery

## 2015-07-30 LAB — SURGICAL PATHOLOGY

## 2015-08-17 ENCOUNTER — Telehealth: Payer: Self-pay | Admitting: *Deleted

## 2015-08-17 NOTE — Telephone Encounter (Signed)
Received referral for low dose lung cancer screening CT scan. Voicemail left at phone number listed in EMR for patient to call me back to facilitate scheduling scan.  

## 2015-09-01 ENCOUNTER — Telehealth: Payer: Self-pay | Admitting: *Deleted

## 2015-09-01 NOTE — Telephone Encounter (Signed)
Received referral for initial lung cancer screening scan. Patient returned call and obtained smoking history,(current , 35 pack year) as well as answering questions related to screening process. Patient denies signs of lung cancer such as weight loss or hemoptysis. Patient denies comorbidity that would prevent curative treatment if lung cancer were found. Patient is tentatively scheduled for shared decision making visit and CT scan on 09/07/15 1:30pm, pending insurance approval from business office.

## 2015-09-03 ENCOUNTER — Other Ambulatory Visit: Payer: Self-pay | Admitting: *Deleted

## 2015-09-03 DIAGNOSIS — Z87891 Personal history of nicotine dependence: Secondary | ICD-10-CM

## 2015-09-07 ENCOUNTER — Encounter: Payer: Self-pay | Admitting: Oncology

## 2015-09-07 ENCOUNTER — Ambulatory Visit: Payer: BLUE CROSS/BLUE SHIELD | Attending: Oncology

## 2015-09-07 ENCOUNTER — Ambulatory Visit: Payer: BLUE CROSS/BLUE SHIELD | Admitting: Oncology

## 2015-09-13 ENCOUNTER — Other Ambulatory Visit: Payer: Self-pay | Admitting: *Deleted

## 2015-09-13 DIAGNOSIS — Z87891 Personal history of nicotine dependence: Secondary | ICD-10-CM

## 2015-09-14 ENCOUNTER — Inpatient Hospital Stay: Payer: BLUE CROSS/BLUE SHIELD | Attending: Oncology | Admitting: Oncology

## 2015-09-14 ENCOUNTER — Ambulatory Visit
Admission: RE | Admit: 2015-09-14 | Discharge: 2015-09-14 | Disposition: A | Discharge status: Home / Self Care | Payer: BLUE CROSS/BLUE SHIELD | Source: Ambulatory Visit | Attending: Oncology | Admitting: Oncology

## 2015-09-14 DIAGNOSIS — Z87891 Personal history of nicotine dependence: Secondary | ICD-10-CM

## 2015-09-14 DIAGNOSIS — Z122 Encounter for screening for malignant neoplasm of respiratory organs: Secondary | ICD-10-CM

## 2015-09-14 DIAGNOSIS — J439 Emphysema, unspecified: Secondary | ICD-10-CM | POA: Diagnosis not present

## 2015-09-14 NOTE — Progress Notes (Signed)
In accordance with CMS guidelines, patient has met eligibility criteria including age, absence of signs or symptoms of lung cancer.  Social History  Substance Use Topics  . Smoking status: Current Every Day Smoker    Packs/day: 1.50    Years: 35.00    Types: Cigarettes  . Smokeless tobacco: Never Used  . Alcohol use Yes     Comment: occas - couple times per year     A shared decision-making session was conducted prior to the performance of CT scan. This includes one or more decision aids, includes benefits and harms of screening, follow-up diagnostic testing, over-diagnosis, false positive rate, and total radiation exposure.  Counseling on the importance of adherence to annual lung cancer LDCT screening, impact of co-morbidities, and ability or willingness to undergo diagnosis and treatment is imperative for compliance of the program.  Counseling on the importance of continued smoking cessation for former smokers; the importance of smoking cessation for current smokers, and information about tobacco cessation interventions have been given to patient including [place] and [facility] quit [place] programs.  Written order for lung cancer screening with LDCT has been given to the patient and any and all questions have been answered to the best of my abilities.   Yearly follow up will be coordinated by Burgess Estelle, Thoracic Navigator.

## 2015-09-16 ENCOUNTER — Telehealth: Payer: Self-pay | Admitting: *Deleted

## 2015-09-16 NOTE — Telephone Encounter (Signed)
Notified patient of LDCT lung cancer screening results with recommendation for 12 month follow up imaging. Also notified of incidental finding noted below. Patient verbalizes understanding.   IMPRESSION: No suspicious pulmonary nodules. Lung-RADS Category 1, negative. Continue annual screening with low-dose chest CT without contrast in 12 months.  Underlying mild to moderate emphysematous changes with possible superimposed interstitial lung disease.  12 mm short axis low right paratracheal node, nonspecific, possibly reactive. Attention at the time of 1 year follow-up is suggested.

## 2016-07-06 ENCOUNTER — Other Ambulatory Visit: Payer: Self-pay | Admitting: Internal Medicine

## 2016-07-06 DIAGNOSIS — Z1231 Encounter for screening mammogram for malignant neoplasm of breast: Secondary | ICD-10-CM

## 2016-07-25 ENCOUNTER — Ambulatory Visit
Admission: RE | Admit: 2016-07-25 | Discharge: 2016-07-25 | Disposition: A | Discharge status: Home / Self Care | Payer: BLUE CROSS/BLUE SHIELD | Source: Ambulatory Visit | Attending: Internal Medicine | Admitting: Internal Medicine

## 2016-07-25 DIAGNOSIS — Z1231 Encounter for screening mammogram for malignant neoplasm of breast: Secondary | ICD-10-CM | POA: Insufficient documentation

## 2016-09-07 ENCOUNTER — Telehealth: Payer: Self-pay | Admitting: *Deleted

## 2016-09-07 NOTE — Telephone Encounter (Signed)
Left message for patient to notify them that it is time to schedule annual low dose lung cancer screening CT scan. Instructed patient to call back to verify information prior to the scan being scheduled.  

## 2016-09-27 ENCOUNTER — Telehealth: Payer: Self-pay | Admitting: *Deleted

## 2016-09-27 NOTE — Telephone Encounter (Signed)
Left message for patient to notify them that it is time to schedule annual low dose lung cancer screening CT scan. Instructed patient to call back to verify information prior to the scan being scheduled.  

## 2016-10-02 ENCOUNTER — Encounter: Payer: Self-pay | Admitting: *Deleted

## 2017-01-11 ENCOUNTER — Telehealth: Payer: Self-pay

## 2017-01-11 NOTE — Telephone Encounter (Signed)
Left voicemail with patient that her pharmacy will need to send a PA request for medication and encouraged to not fill until this has been received so as not to negate the remainder of the Rx.

## 2017-08-15 ENCOUNTER — Ambulatory Visit (INDEPENDENT_AMBULATORY_CARE_PROVIDER_SITE_OTHER): Payer: BLUE CROSS/BLUE SHIELD | Admitting: Podiatry

## 2017-08-15 ENCOUNTER — Ambulatory Visit (INDEPENDENT_AMBULATORY_CARE_PROVIDER_SITE_OTHER): Payer: BLUE CROSS/BLUE SHIELD

## 2017-08-15 ENCOUNTER — Encounter: Payer: Self-pay | Admitting: Podiatry

## 2017-08-15 VITALS — BP 121/62 | HR 62 | Resp 16

## 2017-08-15 DIAGNOSIS — Q828 Other specified congenital malformations of skin: Secondary | ICD-10-CM | POA: Diagnosis not present

## 2017-08-15 DIAGNOSIS — M779 Enthesopathy, unspecified: Principal | ICD-10-CM

## 2017-08-15 DIAGNOSIS — M7751 Other enthesopathy of right foot: Secondary | ICD-10-CM

## 2017-08-15 DIAGNOSIS — M7752 Other enthesopathy of left foot: Secondary | ICD-10-CM | POA: Diagnosis not present

## 2017-08-15 DIAGNOSIS — M778 Other enthesopathies, not elsewhere classified: Secondary | ICD-10-CM

## 2017-08-15 NOTE — Progress Notes (Signed)
Subjective:  Patient ID: Leah CushingVickie A Riley, female    DOB: 11/12/1955,  MRN: [id number] HPI Chief Complaint  Patient presents with  . Foot Pain    Sub 1st and 5th MPJ right - tender, callused areas x 3 months, using padding to cushion, painful to walk   . New Patient (Initial Visit)    62 y.o. female presents with the above complaint.   ROS: Denies fever chills nausea vomiting muscle aches pains calf pain back pain chest pain shortness of breath.  Past Medical History:  Diagnosis Date  . COPD (chronic obstructive pulmonary disease) (HCC)   . GERD (gastroesophageal reflux disease)   . Headache    migraines  . Hypertension    Past Surgical History:  Procedure Laterality Date  . ABDOMINAL HYSTERECTOMY    . APPENDECTOMY    . BREAST SURGERY Right    cyst removed  . CYST EXCISION Left 07/28/2015   Procedure: CYST REMOVAL OF SUBCUTANEOUS LEFT KNEE;  Surgeon: Christena FlakeJohn J Poggi, MD;  [facility];  Service: Orthopedics;  Laterality: Left;  . FOOT SURGERY Right    x3  . KNEE ARTHROSCOPY Left    x2  . SHOULDER ARTHROSCOPY Right 05/26/2015   Procedure: SHOULDER RIGHT LIMITED ARTHROSCOPIC DEBRIDEMENT ARTHROSCOPIC SUBACROMIAL DECOMPRESSION MINI OPEN ROTATOR CUFF REPAIR MINI OPEN BICEPS TENODESIS;  Surgeon: Christena FlakeJohn J Poggi, MD;  [facility];  Service: Orthopedics;  Laterality: Right;  . TUBAL LIGATION      Current Outpatient Medications:  .  amLODipine (NORVASC) 5 MG tablet, Take 5 mg by mouth daily., Disp: , Rfl:  .  aspirin 81 MG tablet, Take 81 mg by mouth daily., Disp: , Rfl:  .  atorvastatin (LIPITOR) 20 MG tablet, Take 20 mg by mouth daily., Disp: , Rfl:  .  azithromycin (ZITHROMAX) 250 MG tablet, azithromycin 250 mg tablet  TAKE 2 TABLET TODAY THEN 1 TABLET A DAY FOR DAYS 2-5, Disp: , Rfl:  .  budesonide-formoterol (SYMBICORT) 80-4.5 MCG/ACT inhaler, Inhale 2 puffs into the lungs 2 (two) times daily as needed., Disp: , Rfl:  .  Calcium Carbonate-Vitamin D  (CALTRATE 600+D PO), Take by mouth 2 (two) times daily., Disp: , Rfl:  .  cyclobenzaprine (FLEXERIL) 10 MG tablet, Take 10 mg by mouth 3 (three) times daily as needed for muscle spasms., Disp: , Rfl:  .  estradiol (CLIMARA - DOSED IN MG/24 HR) 0.1 mg/24hr patch, Place 0.1 mg onto the skin once a week., Disp: , Rfl:  .  fluticasone (FLONASE) 50 MCG/ACT nasal spray, Place into both nostrils daily., Disp: , Rfl:  .  loratadine (CLARITIN) 10 MG tablet, Take 10 mg by mouth daily., Disp: , Rfl:  .  meloxicam (MOBIC) 15 MG tablet, Take 15 mg by mouth daily., Disp: , Rfl: 0 .  mirtazapine (REMERON) 30 MG tablet, TAKE 1 TABLET BY MOUTH ONCE DAILY. NEEDS FOLLOW UP VISIT FOR MORE REFILLS., Disp: , Rfl: 0 .  montelukast (SINGULAIR) 10 MG tablet, Take 10 mg by mouth daily., Disp: , Rfl:  .  multivitamin-iron-minerals-folic acid (CENTRUM) chewable tablet, Chew 1 tablet by mouth daily., Disp: , Rfl:  .  nabumetone (RELAFEN) 500 MG tablet, Take 500 mg by mouth 2 (two) times daily., Disp: , Rfl:  .  omega-3 acid ethyl esters (LOVAZA) 1 g capsule, Take by mouth., Disp: , Rfl:  .  VITAMIN E PO, Take by mouth daily., Disp: , Rfl:   Allergies  Allergen Reactions  . Gluten Meal Nausea And  Vomiting    Itching, blistering  . Sulfa Antibiotics Swelling    Rash, itching   Review of Systems Objective:   Vitals:   08/15/17 1003  BP: 121/62  Pulse: 62  Resp: 16    General: Well developed, nourished, in no acute distress, alert and oriented x3   Dermatological: Skin is warm, dry and supple bilateral. Nails x 10 are well maintained; remaining integument appears unremarkable at this time. There are no open sores, no preulcerative lesions, no rash or signs of infection present.  Porokeratotic lesion plantar lateral aspect of the fifth metatarsal head area because of the laterality I feel that more than likely has to do with tight shoe gear.  Vascular: Dorsalis Pedis artery and Posterior Tibial artery pedal pulses  are 2/4 bilateral with immedate capillary fill time. Pedal hair growth present. No varicosities and no lower extremity edema present bilateral.   Neruologic: Grossly intact via light touch bilateral. Vibratory intact via tuning fork bilateral. Protective threshold with Semmes Wienstein monofilament intact to all pedal sites bilateral. Patellar and Achilles deep tendon reflexes 2+ bilateral. No Babinski or clonus noted bilateral.   Musculoskeletal: No gross boney pedal deformities bilateral. No pain, crepitus, or limitation noted with foot and ankle range of motion bilateral. Muscular strength 5/5 in all groups tested bilateral.  Gait: Unassisted, Nonantalgic.    Radiographs:  Radiographs taken today demonstrate 1/5 metatarsal head resection with some hypertrophic bone growth plantarly.  Assessment & Plan:   Assessment: Porokeratotic lesion reactive hyperkeratotic lesion sub-fifth metatarsal head area right foot.  Plan: Debrided the area today placed padding recommended wider shoe gear.  If this fails to alleviate her symptoms then we will consider orthotics.     Mysty Kielty T. GanadoHyatt, North DakotaDPM

## 2017-09-19 ENCOUNTER — Ambulatory Visit (INDEPENDENT_AMBULATORY_CARE_PROVIDER_SITE_OTHER): Payer: BLUE CROSS/BLUE SHIELD | Admitting: Podiatry

## 2017-09-19 ENCOUNTER — Encounter: Payer: Self-pay | Admitting: Podiatry

## 2017-09-19 DIAGNOSIS — Q828 Other specified congenital malformations of skin: Secondary | ICD-10-CM | POA: Diagnosis not present

## 2017-09-19 NOTE — Progress Notes (Signed)
She presents today for follow-up of porokeratosis plantar aspect of the right foot.  States that is doing a lot better it does not hurt that much anymore.  Objective: Vital signs are stable she is alert and oriented x3.  Porokeratotic lesion sub-first sub-fifth metatarsals of the right foot.  Pulses remain palpable neurologic sensorium is intact degenerative flexors are intact.  Assessment: Plantarflexed first and fifth metatarsal resulting in porokeratotic lesions.  Plan: Debrided these lesions today manually and she was scanned for orthotics today.

## 2017-10-03 ENCOUNTER — Other Ambulatory Visit: Payer: Self-pay | Admitting: Internal Medicine

## 2017-10-03 DIAGNOSIS — Z1231 Encounter for screening mammogram for malignant neoplasm of breast: Secondary | ICD-10-CM

## 2017-10-10 ENCOUNTER — Other Ambulatory Visit: Payer: BLUE CROSS/BLUE SHIELD | Admitting: Orthotics

## 2017-10-17 ENCOUNTER — Ambulatory Visit
Admission: RE | Admit: 2017-10-17 | Discharge: 2017-10-17 | Disposition: A | Discharge status: Home / Self Care | Payer: BLUE CROSS/BLUE SHIELD | Source: Ambulatory Visit | Attending: Internal Medicine | Admitting: Internal Medicine

## 2017-10-17 DIAGNOSIS — Z1231 Encounter for screening mammogram for malignant neoplasm of breast: Secondary | ICD-10-CM | POA: Diagnosis present

## 2017-10-18 ENCOUNTER — Other Ambulatory Visit: Payer: Self-pay | Admitting: Internal Medicine

## 2017-10-18 DIAGNOSIS — R928 Other abnormal and inconclusive findings on diagnostic imaging of breast: Secondary | ICD-10-CM

## 2017-10-31 ENCOUNTER — Ambulatory Visit
Admission: RE | Admit: 2017-10-31 | Discharge: 2017-10-31 | Disposition: A | Discharge status: Home / Self Care | Payer: BLUE CROSS/BLUE SHIELD | Source: Ambulatory Visit | Attending: Internal Medicine | Admitting: Internal Medicine

## 2017-10-31 ENCOUNTER — Ambulatory Visit (INDEPENDENT_AMBULATORY_CARE_PROVIDER_SITE_OTHER): Payer: BLUE CROSS/BLUE SHIELD | Admitting: Podiatry

## 2017-10-31 ENCOUNTER — Encounter: Payer: Self-pay | Admitting: Podiatry

## 2017-10-31 ENCOUNTER — Other Ambulatory Visit: Payer: BLUE CROSS/BLUE SHIELD | Admitting: Orthotics

## 2017-10-31 DIAGNOSIS — R928 Other abnormal and inconclusive findings on diagnostic imaging of breast: Secondary | ICD-10-CM

## 2017-10-31 DIAGNOSIS — Q828 Other specified congenital malformations of skin: Secondary | ICD-10-CM

## 2017-10-31 NOTE — Progress Notes (Signed)
She presents to pick up her orthotics today also for a painful porokeratosis sub-fifth right.  Objective: Vital signs are stable she is alert and oriented x3.  Doing much better she says.  There is no erythema edema cellulitis drainage or odor appears to be healing very nicely.  I debrided the reactive hyperkeratotic lesion sub-fifth metatarsal head of the right foot.  The nucleated the lesion relieving her pain.  Assessment: Debrided porokeratotic lesion.  Plan: Debrided porokeratotic lesion pick up orthotics follow-up with me in 6 weeks if necessary.

## 2018-02-05 ENCOUNTER — Ambulatory Visit: Payer: BLUE CROSS/BLUE SHIELD | Admitting: Pulmonary Disease

## 2018-02-05 ENCOUNTER — Encounter: Payer: Self-pay | Admitting: Pulmonary Disease

## 2018-02-05 VITALS — BP 122/70 | HR 69 | Ht 62.0 in | Wt 118.6 lb

## 2018-02-05 DIAGNOSIS — F17210 Nicotine dependence, cigarettes, uncomplicated: Secondary | ICD-10-CM

## 2018-02-05 DIAGNOSIS — R05 Cough: Secondary | ICD-10-CM | POA: Diagnosis not present

## 2018-02-05 DIAGNOSIS — K219 Gastro-esophageal reflux disease without esophagitis: Secondary | ICD-10-CM

## 2018-02-05 DIAGNOSIS — J439 Emphysema, unspecified: Secondary | ICD-10-CM

## 2018-02-05 DIAGNOSIS — R059 Cough, unspecified: Secondary | ICD-10-CM

## 2018-02-05 MED ORDER — SPACER/AERO-HOLD CHAMBER BAGS MISC: 1.0000 | Freq: Every day | 0 refills | Status: DC

## 2018-02-05 MED ORDER — BUDESONIDE-FORMOTEROL FUMARATE 160-4.5 MCG/ACT IN AERO: 2.0000 | INHALATION_SPRAY | Freq: Two times a day (BID) | RESPIRATORY_TRACT | 12 refills | Status: DC

## 2018-02-05 MED ORDER — OMEPRAZOLE 40 MG PO CPDR: 40.0000 mg | DELAYED_RELEASE_CAPSULE | Freq: Every day | ORAL | 2 refills | Status: DC

## 2018-02-05 MED ORDER — BUDESONIDE-FORMOTEROL FUMARATE 160-4.5 MCG/ACT IN AERO: 2.0000 | INHALATION_SPRAY | Freq: Two times a day (BID) | RESPIRATORY_TRACT | 11 refills | Status: DC

## 2018-02-05 NOTE — Progress Notes (Signed)
Subjective:    Patient ID: Leah Riley, female    DOB: 09-15-1955, 63 y.o.   MRN: [id number]  HPI The patient is a 63 year old current smoker (0.5 PPD) who presents for evaluation and management of a cough that has been present for approximately 2 to 3 years.  The patient is kindly referred by Grayling Congress, FNP.  The patient states that her cough started after a bout of pneumonia and that it never went away.  She states that nothing has really helped the cough including antibiotics, and albuterol inhalers.  Here recently she was started on Symbicort 80/4.5 (started approximately 2 months ago) and notes that this has offered some but not complete relief.  She notes chronic postnasal drip for which she takes Claritin or Allegra.  She also takes Singulair but does not think this medication does much for her.  She takes fish oils but does not note any flux issues associated with taking these.  She does have gastroesophageal reflux symptoms frequently, she has been placed on Protonix in the past but is not taking this medication at present.  She has not had any orthopnea or paroxysmal nocturnal dyspnea.  No lower extremity edema.  No tachypalpitations.  Cough is occasionally productive of yellowish sputum, there is no hemoptysis.  Allergies are noted to gluten (GI distress) and to sulfa (anaphylaxis).  Last medical history, surgical history and family history have been reviewed.  As noted the patient is a current smoker she has been smoking for 40 years 1 pack of cigarettes per day here recently she has cut back to half a pack of cigarettes per day.  She currently works at [employer].  Has no occupational exposures.  No pets in the home.  No military history.  Has not resited outside of [place].  No recent outside the country travel.  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   Review of Systems  Constitutional: Negative.     HENT: Positive for congestion and postnasal drip.   Eyes: Negative.   Respiratory: Positive for cough.   Cardiovascular: Negative.   Gastrointestinal:       Gastroesophageal reflux symptoms, occasional.  Endocrine: Negative.   Genitourinary: Negative.   Musculoskeletal: Negative.   Skin: Negative.   Allergic/Immunologic: Negative.   Neurological: Negative.   Hematological: Negative.   Psychiatric/Behavioral: Negative.   All other systems reviewed and are negative.      Objective:   Physical Exam Vitals signs reviewed.  Constitutional:      Appearance: Normal appearance. She is normal weight.     Comments: Appears older than stated age.  HENT:     Head: Normocephalic and atraumatic.     Jaw: There is normal jaw occlusion.     Right Ear: Tympanic membrane and ear canal normal.     Left Ear: Tympanic membrane and ear canal normal.     Nose: Congestion present.     Right Turbinates: Swollen.     Left Turbinates: Swollen.     Mouth/Throat:     Dentition: Dental caries present.     Pharynx: Posterior oropharyngeal erythema present.     Comments: Coated tongue with significant papillation posteriorly. Eyes:     Extraocular Movements: Extraocular movements intact.     Conjunctiva/sclera: Conjunctivae normal.     Pupils: Pupils are equal, round, and reactive to light.  Neck:     Musculoskeletal: Neck supple.  Cardiovascular:     Rate and Rhythm: Normal rate and regular rhythm.     Pulses: Normal pulses.     Heart sounds: Normal heart sounds.  Pulmonary:     Effort: Pulmonary effort is normal. No respiratory distress.     Breath sounds: No wheezing or rhonchi.     Comments: Coarse breath sounds. Abdominal:     General: Abdomen is flat. There is no distension.     Palpations: Abdomen is soft.  Musculoskeletal: Normal range of motion.  Lymphadenopathy:     Cervical: No cervical adenopathy.  Skin:    General: Skin is warm and dry.  Neurological:     General: No focal  deficit present.     Mental Status: She is alert and oriented to person, place, and time.  Psychiatric:        Mood and Affect: Mood normal.        Behavior: Behavior normal.    I have reviewed most recent chest CT which was a lung cancer screening CT performed September 14, 2015 the patient showed evidence of COPD with early associated fibrosis.  Possible early respiratory bronchiolitis (smokers bronchiolitis).  Patient states she has had recent chest x-[place] however this is not available for our review   I have also reviewed prior evaluation by Dr. Ned ClinesHerbon Fleming who evaluated the patient for this issue in 2016.  PFTs from that time showed an FEV1 of 1.86 L or 84% predicted and an FVC of 3.12 liters FEV1/FVC was 59%.  Diffusion capacity was noted to be at 45% predicted at that time.  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   SPIROMETRY: This was performed today and shows an FEV1 of 1.9 L and an FEV1/FVC of 68%.  Showing persistent chronic obstruction.  Assessment & Plan:   1.  Cough: Patient has had cough that is now classified as chronic cough.  She appears to have significant triggers in the form of postnasal drip and by physical exam it appears that she still having issues with reflux.  Likely laryngopharyngeal reflux.  The patient is on Fosamax which can make reflux worse aggravating this issue.  In addition she appears to have some partial relief with Symbicort indicating that there may be some issues with airway inflammation.  Suspect she is responding to the inhaled corticosteroid component.  She has been advised to avoid mint or menthol cough drops as this can make the cough worse.  Also to discontinue smoking.  Plan for management will be as below.  2.  COPD with emphysema: We will increase Symbicort 160/4.5, 2 inhalations twice a day.  She was provided with a spacer for delivery of the medication.  She will also continue Spiriva.  We will obtain formal  PFTs.  3.  Laryngopharyngeal reflux: In her upper airway exam there is evidence that she is likely still having issues with reflux.  Will recommend omeprazole 40 mg daily.  She had previously been on a PPI but is no longer taking this medication.  Also she was reminded that Fosamax can worsen gastroesophageal reflux and/or LPR.  4.  Tobacco dependence due to cigarettes: She has been advised to discontinue smoking.  Counseling time 3 to 5 minutes.  She will also need to remain on a lung cancer screening protocol.  She is overdue over a year for low-dose CT of the chest for lung cancer screening.  Though she has cough as a symptom this symptom began prior to  her post recent CT and is a chronic symptom.  Have recommended that she start the series again.  We will see the patient in follow-up in 6 to 8 weeks time.  She is to contact us prior to that time should any new difficulties arise.

## 2018-02-05 NOTE — Patient Instructions (Addendum)
1.  Avoid mint or menthol and cough drops.  This can make the cough worse.  2.  Continue efforts to discontinue smoking.  3.  We will increase your Symbicort to 160/4.5, 2 inhalations twice a day use a spacer.  4.  I will give you a trial of omeprazole to control potential acid reflux.  5.  Also recall that medications like Fosamax for the bones can also make cough worse.  6.  We will see you in follow-up in 6 to 8 weeks time.  We will also schedule for more formal breathing test.  7.  Discussed with your primary care physician you do need to have a yearly lung cancer screening CT.  It appears that you are overdue by a year.

## 2018-03-19 ENCOUNTER — Ambulatory Visit: Payer: BLUE CROSS/BLUE SHIELD | Attending: Pulmonary Disease

## 2018-03-26 ENCOUNTER — Ambulatory Visit: Payer: BLUE CROSS/BLUE SHIELD | Admitting: Pulmonary Disease

## 2018-07-23 ENCOUNTER — Other Ambulatory Visit: Payer: Self-pay | Admitting: Family

## 2018-07-23 DIAGNOSIS — R921 Mammographic calcification found on diagnostic imaging of breast: Secondary | ICD-10-CM

## 2018-07-30 ENCOUNTER — Ambulatory Visit
Admission: RE | Admit: 2018-07-30 | Discharge: 2018-07-30 | Disposition: A | Discharge status: Home / Self Care | Payer: BC Managed Care – PPO | Source: Ambulatory Visit | Attending: Family | Admitting: Family

## 2018-07-30 ENCOUNTER — Other Ambulatory Visit: Payer: Self-pay

## 2018-07-30 DIAGNOSIS — R921 Mammographic calcification found on diagnostic imaging of breast: Secondary | ICD-10-CM | POA: Insufficient documentation

## 2018-07-31 ENCOUNTER — Other Ambulatory Visit: Payer: Self-pay | Admitting: Family

## 2018-07-31 DIAGNOSIS — R921 Mammographic calcification found on diagnostic imaging of breast: Secondary | ICD-10-CM

## 2018-12-09 ENCOUNTER — Other Ambulatory Visit: Payer: Self-pay | Admitting: Internal Medicine

## 2018-12-09 DIAGNOSIS — N289 Disorder of kidney and ureter, unspecified: Secondary | ICD-10-CM

## 2018-12-16 ENCOUNTER — Other Ambulatory Visit: Payer: Self-pay

## 2018-12-16 ENCOUNTER — Ambulatory Visit
Admission: RE | Admit: 2018-12-16 | Discharge: 2018-12-16 | Disposition: A | Discharge status: Home / Self Care | Payer: BC Managed Care – PPO | Source: Ambulatory Visit | Attending: Internal Medicine | Admitting: Internal Medicine

## 2018-12-16 DIAGNOSIS — N289 Disorder of kidney and ureter, unspecified: Secondary | ICD-10-CM | POA: Insufficient documentation

## 2019-02-17 DIAGNOSIS — F17200 Nicotine dependence, unspecified, uncomplicated: Secondary | ICD-10-CM | POA: Insufficient documentation

## 2019-03-13 ENCOUNTER — Ambulatory Visit
Admission: RE | Admit: 2019-03-13 | Discharge: 2019-03-13 | Disposition: A | Discharge status: Home / Self Care | Payer: BC Managed Care – PPO | Source: Ambulatory Visit | Attending: Family | Admitting: Family

## 2019-03-13 DIAGNOSIS — R921 Mammographic calcification found on diagnostic imaging of breast: Secondary | ICD-10-CM

## 2019-06-19 ENCOUNTER — Telehealth: Payer: Self-pay | Admitting: Podiatry

## 2019-06-19 NOTE — Telephone Encounter (Signed)
Left message for pt to call to discuss ordering a second pair of orthotics. She had originally sent a email to [employer] via website question.

## 2019-07-29 DIAGNOSIS — M169 Osteoarthritis of hip, unspecified: Secondary | ICD-10-CM | POA: Insufficient documentation

## 2019-09-18 ENCOUNTER — Other Ambulatory Visit: Payer: Self-pay | Admitting: Orthopedic Surgery

## 2019-09-18 DIAGNOSIS — M76892 Other specified enthesopathies of left lower limb, excluding foot: Secondary | ICD-10-CM

## 2019-09-30 ENCOUNTER — Other Ambulatory Visit: Payer: Self-pay | Admitting: Orthopedic Surgery

## 2019-09-30 DIAGNOSIS — M76892 Other specified enthesopathies of left lower limb, excluding foot: Secondary | ICD-10-CM

## 2019-10-02 ENCOUNTER — Ambulatory Visit
Admission: RE | Admit: 2019-10-02 | Discharge: 2019-10-02 | Disposition: A | Discharge status: Home / Self Care | Payer: BC Managed Care – PPO | Source: Ambulatory Visit | Attending: Orthopedic Surgery | Admitting: Orthopedic Surgery

## 2019-10-02 ENCOUNTER — Other Ambulatory Visit: Payer: Self-pay

## 2019-10-02 DIAGNOSIS — M76892 Other specified enthesopathies of left lower limb, excluding foot: Secondary | ICD-10-CM | POA: Diagnosis present

## 2020-02-03 ENCOUNTER — Other Ambulatory Visit: Payer: Self-pay | Admitting: Family

## 2020-02-03 DIAGNOSIS — M545 Low back pain, unspecified: Secondary | ICD-10-CM

## 2020-02-03 DIAGNOSIS — M5410 Radiculopathy, site unspecified: Secondary | ICD-10-CM

## 2020-02-03 DIAGNOSIS — M5416 Radiculopathy, lumbar region: Secondary | ICD-10-CM

## 2020-02-11 IMAGING — MG MM DIGITAL SCREENING BILAT W/ TOMO W/ CAD
8 series · 8 of 24 positions shown · non-contrast
Comparison: Previous exam(s).

CLINICAL DATA: Screening.

EXAM:
DIGITAL SCREENING BILATERAL MAMMOGRAM WITH TOMO AND CAD

[L CC synth-2D]
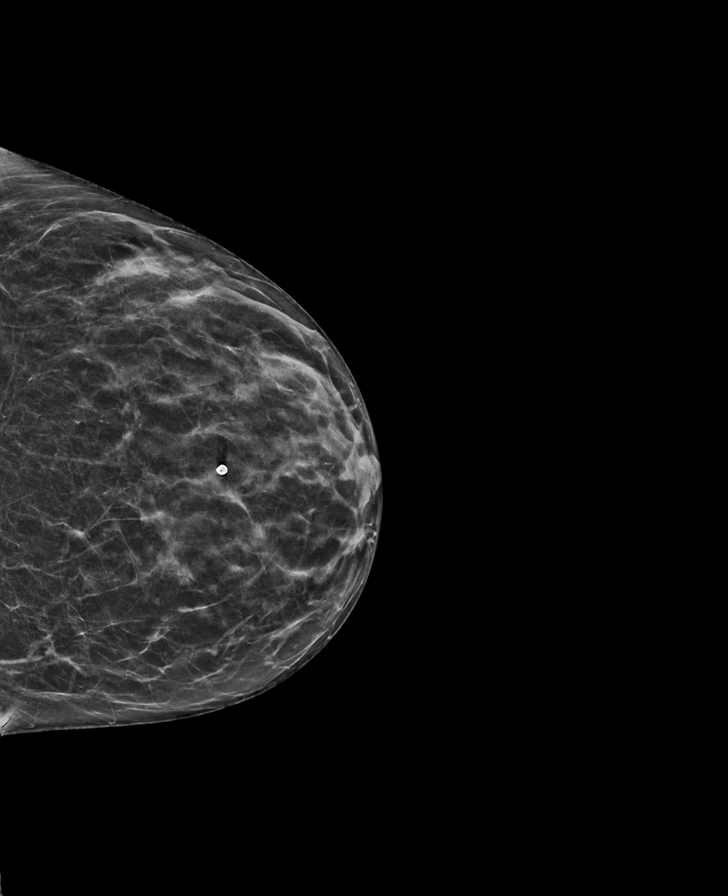

[R MLO synth-2D]
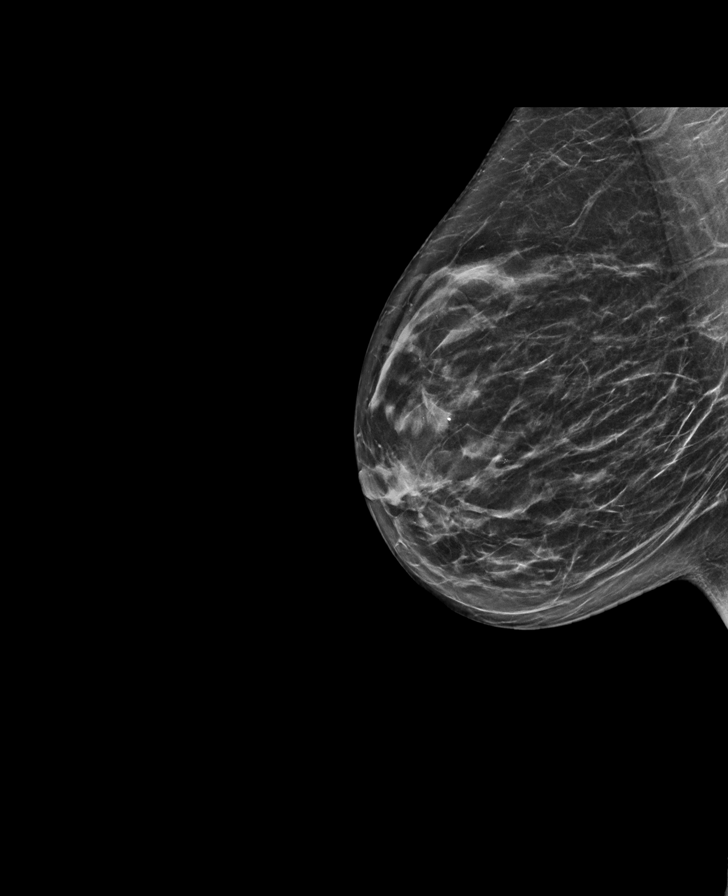

[L MLO synth-2D]
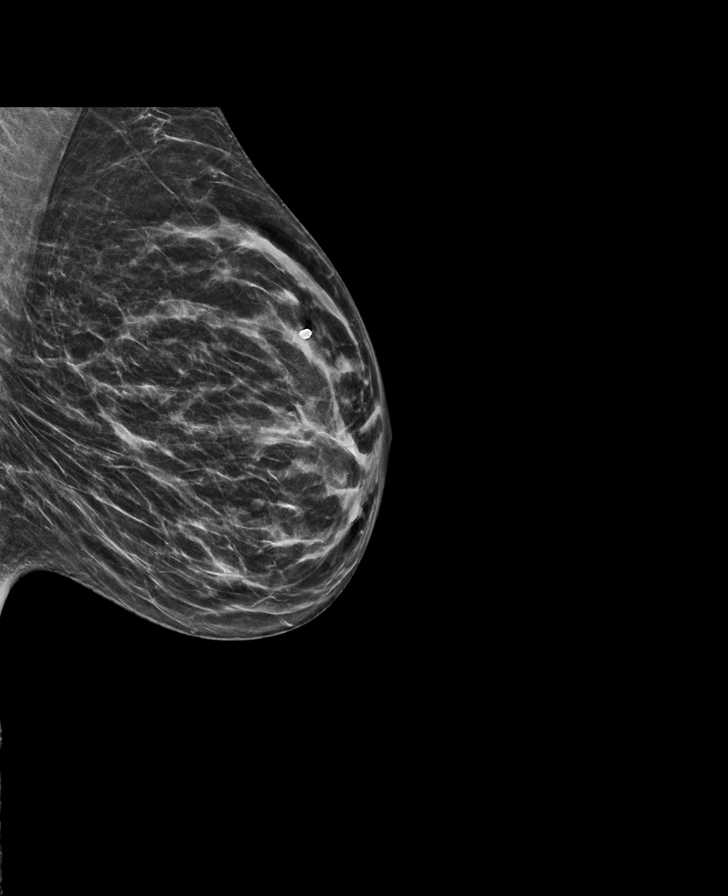

[R CC synth-2D]
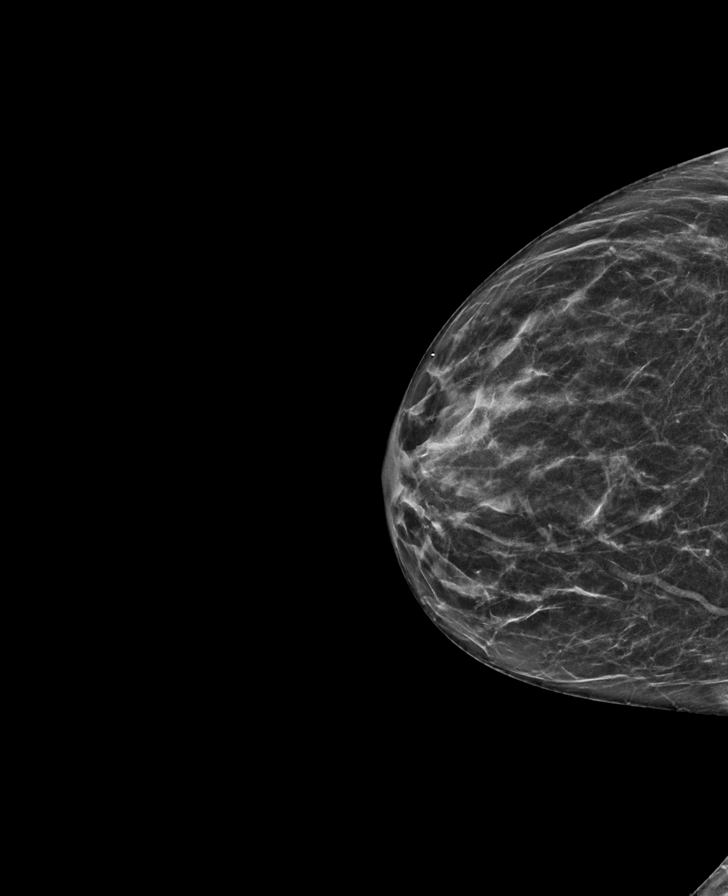

[R MLO tomo · tomo slice 33/66.0]
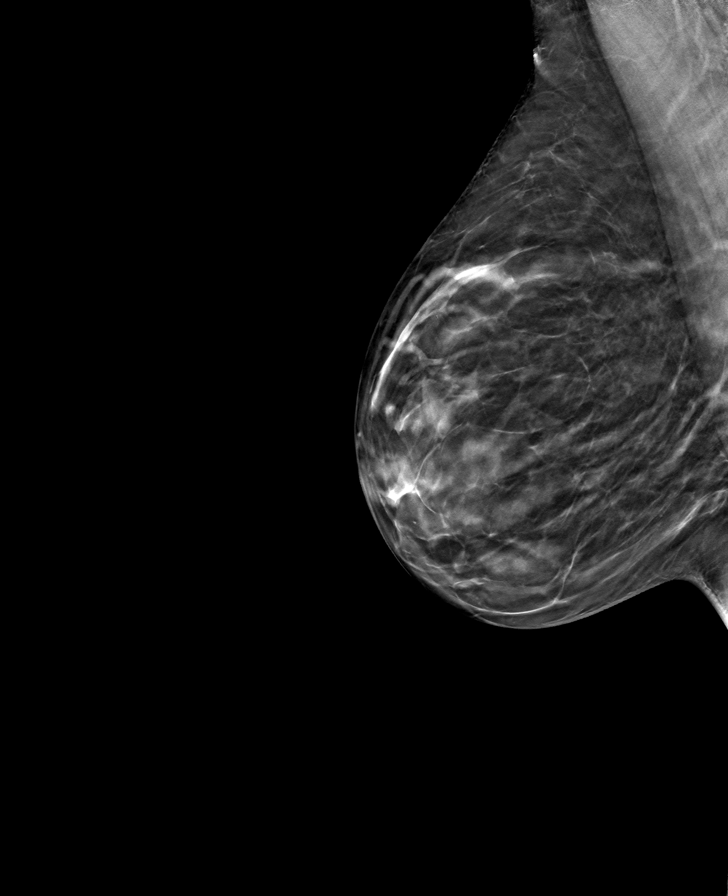

[L MLO tomo · tomo slice 25/49.0]
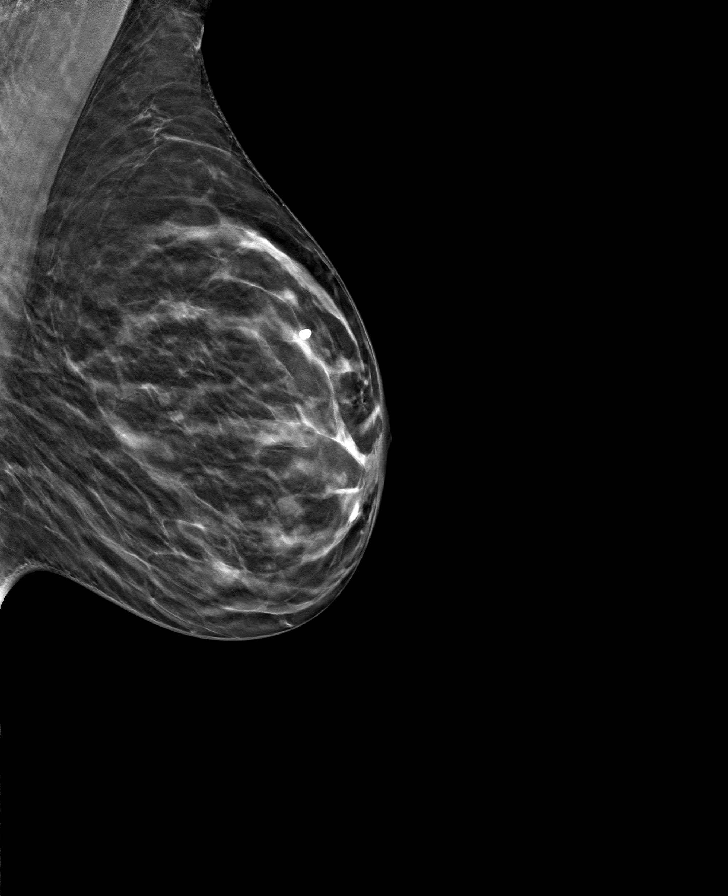

[L CC tomo · tomo slice 27/52.0]
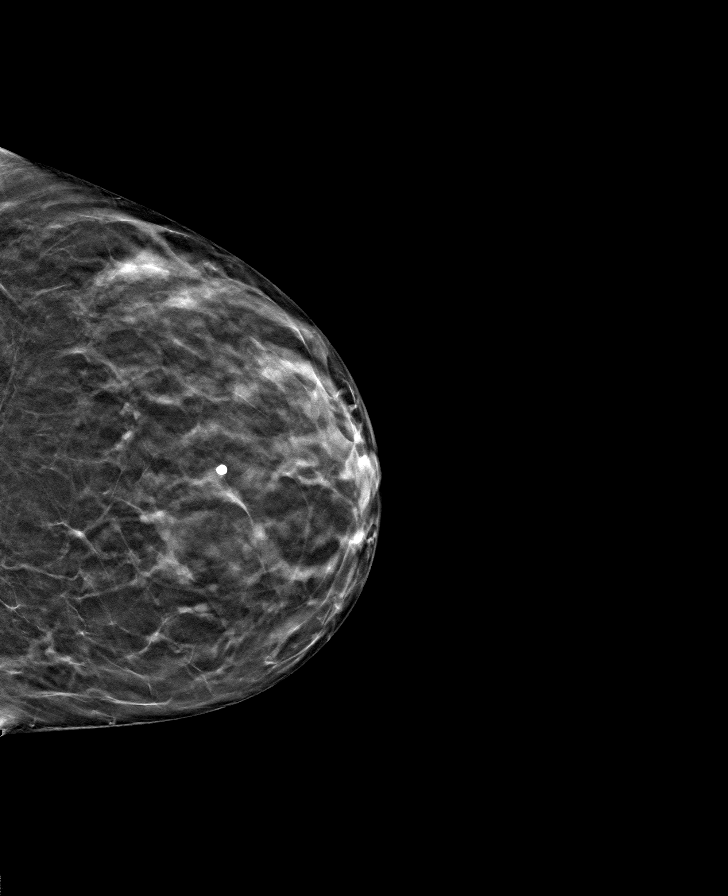

[R CC tomo · tomo slice 27/54.0]
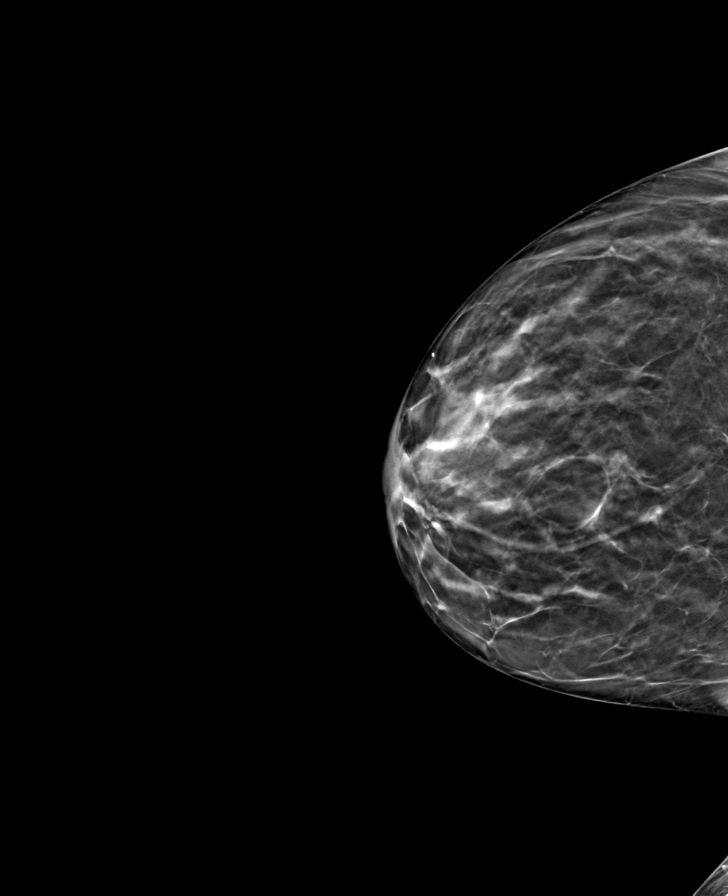

[8 of 24 positions shown; findings below may reference images not displayed]

ACR Breast Density Category b: There are scattered areas of
fibroglandular density.
FINDINGS: In the right breast a group above calcifications requires further
evaluation.

In the left breast and asymmetry requires further evaluation.

Images were processed with CAD.
IMPRESSION: Further evaluation is suggested for possible calcifications in the
right breast.

Further evaluation is suggested for possible asymmetry in the left
breast.

RECOMMENDATION:
Diagnostic mammogram and possibly ultrasound of both breasts.
(Code:S4-K-LL0)

The patient will be contacted regarding the findings, and additional
imaging will be scheduled.

BI-RADS CATEGORY  0: Incomplete. Need additional imaging evaluation
and/or prior mammograms for comparison.

## 2020-02-18 ENCOUNTER — Ambulatory Visit
Admission: RE | Admit: 2020-02-18 | Discharge: 2020-02-18 | Disposition: A | Discharge status: Home / Self Care | Payer: BC Managed Care – PPO | Source: Ambulatory Visit | Attending: Family | Admitting: Family

## 2020-02-18 ENCOUNTER — Other Ambulatory Visit: Payer: Self-pay

## 2020-02-18 DIAGNOSIS — M545 Low back pain, unspecified: Secondary | ICD-10-CM | POA: Insufficient documentation

## 2020-02-18 DIAGNOSIS — M5410 Radiculopathy, site unspecified: Secondary | ICD-10-CM | POA: Diagnosis not present

## 2020-02-18 DIAGNOSIS — M5416 Radiculopathy, lumbar region: Secondary | ICD-10-CM | POA: Diagnosis present

## 2020-02-25 ENCOUNTER — Other Ambulatory Visit: Payer: Self-pay | Admitting: Internal Medicine

## 2020-02-25 DIAGNOSIS — R921 Mammographic calcification found on diagnostic imaging of breast: Secondary | ICD-10-CM

## 2020-03-18 ENCOUNTER — Ambulatory Visit
Admission: RE | Admit: 2020-03-18 | Discharge: 2020-03-18 | Disposition: A | Discharge status: Home / Self Care | Payer: BC Managed Care – PPO | Source: Ambulatory Visit | Attending: Internal Medicine | Admitting: Internal Medicine

## 2020-03-18 ENCOUNTER — Other Ambulatory Visit: Payer: Self-pay

## 2020-03-18 DIAGNOSIS — R921 Mammographic calcification found on diagnostic imaging of breast: Secondary | ICD-10-CM

## 2021-04-28 ENCOUNTER — Other Ambulatory Visit: Payer: Self-pay | Admitting: Internal Medicine

## 2021-04-28 DIAGNOSIS — Z1231 Encounter for screening mammogram for malignant neoplasm of breast: Secondary | ICD-10-CM

## 2021-06-02 ENCOUNTER — Ambulatory Visit
Admission: RE | Admit: 2021-06-02 | Discharge: 2021-06-02 | Disposition: A | Discharge status: Home / Self Care | Payer: BC Managed Care – PPO | Source: Ambulatory Visit | Attending: Internal Medicine | Admitting: Internal Medicine

## 2021-06-02 DIAGNOSIS — Z1231 Encounter for screening mammogram for malignant neoplasm of breast: Secondary | ICD-10-CM

## 2021-11-07 DIAGNOSIS — F17210 Nicotine dependence, cigarettes, uncomplicated: Secondary | ICD-10-CM | POA: Diagnosis not present

## 2021-11-07 DIAGNOSIS — F17218 Nicotine dependence, cigarettes, with other nicotine-induced disorders: Secondary | ICD-10-CM | POA: Diagnosis not present

## 2021-11-07 DIAGNOSIS — Z122 Encounter for screening for malignant neoplasm of respiratory organs: Secondary | ICD-10-CM | POA: Diagnosis not present

## 2021-11-07 DIAGNOSIS — K219 Gastro-esophageal reflux disease without esophagitis: Secondary | ICD-10-CM | POA: Diagnosis not present

## 2021-11-07 DIAGNOSIS — J449 Chronic obstructive pulmonary disease, unspecified: Secondary | ICD-10-CM | POA: Diagnosis not present

## 2021-11-07 DIAGNOSIS — J3089 Other allergic rhinitis: Secondary | ICD-10-CM | POA: Diagnosis not present

## 2021-11-07 DIAGNOSIS — R0609 Other forms of dyspnea: Secondary | ICD-10-CM | POA: Diagnosis not present

## 2021-12-02 DIAGNOSIS — I10 Essential (primary) hypertension: Secondary | ICD-10-CM | POA: Diagnosis not present

## 2021-12-02 DIAGNOSIS — J209 Acute bronchitis, unspecified: Secondary | ICD-10-CM | POA: Diagnosis not present

## 2021-12-02 DIAGNOSIS — J449 Chronic obstructive pulmonary disease, unspecified: Secondary | ICD-10-CM | POA: Diagnosis not present

## 2021-12-02 DIAGNOSIS — E782 Mixed hyperlipidemia: Secondary | ICD-10-CM | POA: Diagnosis not present

## 2021-12-08 DIAGNOSIS — J449 Chronic obstructive pulmonary disease, unspecified: Secondary | ICD-10-CM | POA: Diagnosis not present

## 2021-12-08 DIAGNOSIS — K219 Gastro-esophageal reflux disease without esophagitis: Secondary | ICD-10-CM | POA: Diagnosis not present

## 2021-12-08 DIAGNOSIS — F17200 Nicotine dependence, unspecified, uncomplicated: Secondary | ICD-10-CM | POA: Diagnosis not present

## 2021-12-08 DIAGNOSIS — J209 Acute bronchitis, unspecified: Secondary | ICD-10-CM | POA: Diagnosis not present

## 2021-12-08 DIAGNOSIS — I10 Essential (primary) hypertension: Secondary | ICD-10-CM | POA: Diagnosis not present

## 2021-12-08 DIAGNOSIS — E782 Mixed hyperlipidemia: Secondary | ICD-10-CM | POA: Diagnosis not present

## 2021-12-15 DIAGNOSIS — F17200 Nicotine dependence, unspecified, uncomplicated: Secondary | ICD-10-CM | POA: Diagnosis not present

## 2021-12-15 DIAGNOSIS — E782 Mixed hyperlipidemia: Secondary | ICD-10-CM | POA: Diagnosis not present

## 2021-12-15 DIAGNOSIS — J42 Unspecified chronic bronchitis: Secondary | ICD-10-CM | POA: Diagnosis not present

## 2021-12-15 DIAGNOSIS — I10 Essential (primary) hypertension: Secondary | ICD-10-CM | POA: Diagnosis not present

## 2021-12-15 DIAGNOSIS — J449 Chronic obstructive pulmonary disease, unspecified: Secondary | ICD-10-CM | POA: Diagnosis not present

## 2021-12-15 DIAGNOSIS — K219 Gastro-esophageal reflux disease without esophagitis: Secondary | ICD-10-CM | POA: Diagnosis not present

## 2022-02-09 ENCOUNTER — Other Ambulatory Visit: Payer: Self-pay | Admitting: Family

## 2022-02-16 ENCOUNTER — Ambulatory Visit: Payer: Medicare HMO | Admitting: Internal Medicine

## 2022-03-06 ENCOUNTER — Other Ambulatory Visit: Payer: Self-pay | Admitting: Internal Medicine

## 2022-03-19 ENCOUNTER — Other Ambulatory Visit: Payer: Self-pay | Admitting: Family

## 2022-04-17 ENCOUNTER — Other Ambulatory Visit: Payer: Self-pay | Admitting: Internal Medicine

## 2022-04-29 ENCOUNTER — Other Ambulatory Visit: Payer: Self-pay | Admitting: Family

## 2022-05-14 ENCOUNTER — Other Ambulatory Visit: Payer: Self-pay | Admitting: Family

## 2022-05-14 ENCOUNTER — Other Ambulatory Visit: Payer: Self-pay | Admitting: Internal Medicine

## 2022-05-14 DIAGNOSIS — M75121 Complete rotator cuff tear or rupture of right shoulder, not specified as traumatic: Secondary | ICD-10-CM

## 2022-05-25 ENCOUNTER — Encounter: Payer: Self-pay | Admitting: Internal Medicine

## 2022-05-25 ENCOUNTER — Ambulatory Visit (INDEPENDENT_AMBULATORY_CARE_PROVIDER_SITE_OTHER): Payer: Medicare HMO | Admitting: Internal Medicine

## 2022-05-25 VITALS — BP 160/80 | HR 73 | Ht 62.0 in | Wt 110.8 lb

## 2022-05-25 DIAGNOSIS — E782 Mixed hyperlipidemia: Secondary | ICD-10-CM | POA: Insufficient documentation

## 2022-05-25 DIAGNOSIS — J441 Chronic obstructive pulmonary disease with (acute) exacerbation: Secondary | ICD-10-CM | POA: Diagnosis not present

## 2022-05-25 DIAGNOSIS — J45901 Unspecified asthma with (acute) exacerbation: Secondary | ICD-10-CM

## 2022-05-25 DIAGNOSIS — F17200 Nicotine dependence, unspecified, uncomplicated: Secondary | ICD-10-CM | POA: Diagnosis not present

## 2022-05-25 DIAGNOSIS — J449 Chronic obstructive pulmonary disease, unspecified: Secondary | ICD-10-CM

## 2022-05-25 DIAGNOSIS — I10 Essential (primary) hypertension: Secondary | ICD-10-CM | POA: Diagnosis not present

## 2022-05-25 DIAGNOSIS — F411 Generalized anxiety disorder: Secondary | ICD-10-CM | POA: Diagnosis not present

## 2022-05-25 DIAGNOSIS — K219 Gastro-esophageal reflux disease without esophagitis: Secondary | ICD-10-CM

## 2022-05-25 MED ORDER — MONTELUKAST SODIUM 10 MG PO TABS: 10.0000 mg | ORAL_TABLET | Freq: Every day | ORAL | 3 refills | Status: AC

## 2022-05-25 MED ORDER — LEVOFLOXACIN 500 MG PO TABS: 500.0000 mg | ORAL_TABLET | Freq: Every day | ORAL | 0 refills | Status: AC

## 2022-05-25 MED ORDER — ATORVASTATIN CALCIUM 20 MG PO TABS: 20.0000 mg | ORAL_TABLET | Freq: Every day | ORAL | 3 refills | Status: DC

## 2022-05-25 MED ORDER — SOLIFENACIN SUCCINATE 10 MG PO TABS: 10.0000 mg | ORAL_TABLET | Freq: Every day | ORAL | 0 refills | Status: DC

## 2022-05-25 MED ORDER — PREDNISONE 20 MG PO TABS: 40.0000 mg | ORAL_TABLET | Freq: Every day | ORAL | 0 refills | Status: DC

## 2022-05-25 MED ORDER — AMLODIPINE BESYLATE 5 MG PO TABS: 5.0000 mg | ORAL_TABLET | Freq: Every day | ORAL | 3 refills | Status: DC

## 2022-05-25 NOTE — Progress Notes (Signed)
Established Patient Office Visit  Subjective:  Patient ID: Leah Riley, female    DOB: 10-02-1955  Age: 67 y.o. MRN: [id number]  Chief Complaint  Patient presents with   Acute Visit    Cough/headache for a couple of months    Patient not in since December 2023.  She missed her appointment as her mother passed away 2 months ago.  Patient had stopped smoking completely but since then she has started smoking.  Today she comes in with chest congestion, tightness, difficulty breathing, productive cough.  Patient does get frequent exacerbations of her COPD especially when she is smoking. She does not complain of any fever but does have chills, fatigue, some nausea. She is under care of pulmonologist who has switched her inhalers to Trelegy.  She also has a nebulizer that she has been using at home. Patient's blood pressure is high today, she has not taken her amlodipine yet.    No other concerns at this time.   Past Medical History:  Diagnosis Date   COPD (chronic obstructive pulmonary disease) (HCC)    GERD (gastroesophageal reflux disease)    Headache    migraines   Hypertension     Past Surgical History:  Procedure Laterality Date   ABDOMINAL HYSTERECTOMY     APPENDECTOMY     BREAST CYST EXCISION Right    age 72   BREAST SURGERY Right    cyst removed   CYST EXCISION Left 07/28/2015   Procedure: CYST REMOVAL OF SUBCUTANEOUS LEFT KNEE;  Surgeon: Christena Flake, MD;  Location: [facility];  Service: Orthopedics;  Laterality: Left;   FOOT SURGERY Right    x3   KNEE ARTHROSCOPY Left    x2   OOPHORECTOMY     SHOULDER ARTHROSCOPY Right 05/26/2015   Procedure: SHOULDER RIGHT LIMITED ARTHROSCOPIC DEBRIDEMENT ARTHROSCOPIC SUBACROMIAL DECOMPRESSION MINI OPEN ROTATOR CUFF REPAIR MINI OPEN BICEPS TENODESIS;  Surgeon: Christena Flake, MD;  Location: [facility];  Service: Orthopedics;  Laterality: Right;   TUBAL LIGATION      Social History   Socioeconomic History    Marital status: Divorced    Spouse name: Not on file   Number of children: Not on file   Years of education: Not on file   Highest education level: Not on file  Occupational History   Not on file  Tobacco Use   Smoking status: Every Day    Packs/day: 1.50    Years: 35.00    Additional pack years: 0.00    Total pack years: 52.50    Types: Cigarettes   Smokeless tobacco: Never   Tobacco comments:    0.5 pack a day   Substance and Sexual Activity   Alcohol use: Yes    Comment: occas - couple times per year   Drug use: Not on file   Sexual activity: Not on file  Other Topics Concern   Not on file  Social History Narrative   Not on file   Social Determinants of Health   Financial Resource Strain: Not on file  Food Insecurity: Not on file  Transportation Needs: Not on file  Physical Activity: Not on file  Stress: Not on file  Social Connections: Not on file  Intimate Partner Violence: Not on file    Family History  Problem Relation Age of Onset   Coronary artery disease Mother    Coronary artery disease Father     Allergies  Allergen Reactions   Gluten Meal Nausea And  Vomiting    Itching, blistering   Sulfa Antibiotics Swelling    Rash, itching    Review of Systems  Constitutional:  Positive for chills, malaise/fatigue and weight loss. Negative for fever.  HENT:  Positive for nosebleeds. Negative for congestion, hearing loss, sinus pain, sore throat and tinnitus.   Eyes: Negative.   Respiratory:  Positive for cough, sputum production, shortness of breath and wheezing. Negative for stridor.   Cardiovascular:  Positive for palpitations. Negative for chest pain, orthopnea, claudication and leg swelling.  Gastrointestinal:  Negative for abdominal pain, blood in stool, diarrhea, heartburn, melena, nausea and vomiting.  Genitourinary:  Negative for dysuria, frequency, hematuria and urgency.  Musculoskeletal:  Positive for back pain and myalgias. Negative for falls and  joint pain.  Neurological:  Positive for headaches. Negative for dizziness, tingling, tremors, focal weakness and seizures.  Psychiatric/Behavioral:  Negative for depression, substance abuse and suicidal ideas. The patient is nervous/anxious. The patient does not have insomnia.        Objective:   BP (!) 160/80   Pulse 73   Ht 5\' 2"  (1.575 m)   Wt 110 lb 12.8 oz (50.3 kg)   SpO2 93%   BMI 20.27 kg/m   Vitals:   05/25/22 1035  BP: (!) 160/80  Pulse: 73  Height: 5\' 2"  (1.575 m)  Weight: 110 lb 12.8 oz (50.3 kg)  SpO2: 93%  BMI (Calculated): 20.26    Physical Exam Vitals reviewed.  Constitutional:      Appearance: She is ill-appearing.  HENT:     Right Ear: Tympanic membrane normal.     Left Ear: Tympanic membrane normal.     Nose: Nose normal.     Mouth/Throat:     Pharynx: No oropharyngeal exudate or posterior oropharyngeal erythema.  Eyes:     Conjunctiva/sclera: Conjunctivae normal.  Cardiovascular:     Rate and Rhythm: Normal rate and regular rhythm.     Pulses: Normal pulses.     Heart sounds: Normal heart sounds.  Pulmonary:     Effort: Pulmonary effort is normal. No accessory muscle usage, prolonged expiration or respiratory distress.     Breath sounds: Decreased air movement present. Wheezing and rhonchi present. No rales.  Chest:     Chest wall: No tenderness.  Abdominal:     General: Bowel sounds are normal. There is no distension.     Tenderness: There is no right CVA tenderness, left CVA tenderness, guarding or rebound.  Musculoskeletal:        General: No swelling, tenderness or deformity. Normal range of motion.     Right lower leg: No edema.     Left lower leg: No edema.  Neurological:     General: No focal deficit present.     Mental Status: She is alert and oriented to person, place, and time.  Psychiatric:        Mood and Affect: Mood normal.        Behavior: Behavior normal.      No results found for any visits on 05/25/22.  No  results found for this or any previous visit (from the past 2160 hour(s)).    Assessment & Plan:  Prescription sent for prednisone and Levaquin.  Patient will continue her nebulizer treatments and Trelegy.  Advised to monitor her blood pressure at home also. Resume all medications Problem List Items Addressed This Visit     COPD, moderate (HCC)   Relevant Medications   montelukast (SINGULAIR) 10 MG tablet  predniSONE (DELTASONE) 20 MG tablet   GERD (gastroesophageal reflux disease)   Nicotine dependence, uncomplicated   Mixed hyperlipidemia   Relevant Medications   atorvastatin (LIPITOR) 20 MG tablet   amLODipine (NORVASC) 5 MG tablet   Other Relevant Orders   Lipid Panel w/o Chol/HDL Ratio   Essential hypertension, benign - Primary   Relevant Medications   atorvastatin (LIPITOR) 20 MG tablet   amLODipine (NORVASC) 5 MG tablet   Other Relevant Orders   CMP14+EGFR   GAD (generalized anxiety disorder)   Other Visit Diagnoses     Asthma exacerbation with COPD (chronic obstructive pulmonary disease) (HCC)       Relevant Medications   montelukast (SINGULAIR) 10 MG tablet   predniSONE (DELTASONE) 20 MG tablet   levofloxacin (LEVAQUIN) 500 MG tablet   Other Relevant Orders   CBC With Differential       Return in about 10 days (around 06/04/2022).   Total time spent: 30 minutes  Margaretann Loveless, MD  05/25/2022   This document may have been prepared by [facility] Voice Recognition software and as such may include unintentional dictation errors.

## 2022-05-26 ENCOUNTER — Other Ambulatory Visit: Payer: Self-pay | Admitting: Internal Medicine

## 2022-05-26 ENCOUNTER — Other Ambulatory Visit: Payer: Self-pay | Admitting: Family

## 2022-05-26 DIAGNOSIS — G8929 Other chronic pain: Secondary | ICD-10-CM

## 2022-05-26 LAB — LIPID PANEL W/O CHOL/HDL RATIO
Cholesterol, Total: 181 mg/dL (ref 100–199)
HDL: 60 mg/dL (ref >39)
LDL Chol Calc (NIH): 103 mg/dL — ABNORMAL HIGH (ref 0–99)
Triglycerides: 99 mg/dL (ref 0–149)
VLDL Cholesterol Cal: 18 mg/dL (ref 5–40)

## 2022-05-26 LAB — CMP14+EGFR
ALT: 16 IU/L (ref 0–32)
AST: 20 IU/L (ref 0–40)
Albumin/Globulin Ratio: 1.8 (ref 1.2–2.2)
Albumin: 4.6 g/dL (ref 3.9–4.9)
Alkaline Phosphatase: 106 IU/L (ref 44–121)
BUN/Creatinine Ratio: 14 (ref 12–28)
BUN: 14 mg/dL (ref 8–27)
Bilirubin Total: 0.4 mg/dL (ref 0.0–1.2)
CO2: 23 mmol/L (ref 20–29)
Calcium: 9.9 mg/dL (ref 8.7–10.3)
Chloride: 95 mmol/L — ABNORMAL LOW (ref 96–106)
Creatinine, Ser: 0.97 mg/dL (ref 0.57–1.00)
Globulin, Total: 2.5 g/dL (ref 1.5–4.5)
Glucose: 86 mg/dL (ref 70–99)
Potassium: 4.9 mmol/L (ref 3.5–5.2)
Sodium: 132 mmol/L — ABNORMAL LOW (ref 134–144)
Total Protein: 7.1 g/dL (ref 6.0–8.5)
eGFR: 65 mL/min/{1.73_m2} (ref >59)

## 2022-05-26 LAB — CBC WITH DIFFERENTIAL
Basophils Absolute: 0.1 10*3/uL (ref 0.0–0.2)
Basos: 1 %
EOS (ABSOLUTE): 0.6 10*3/uL — ABNORMAL HIGH (ref 0.0–0.4)
Eos: 6 %
Hematocrit: 38.2 % (ref 34.0–46.6)
Hemoglobin: 12.8 g/dL (ref 11.1–15.9)
Immature Grans (Abs): 0 10*3/uL (ref 0.0–0.1)
Immature Granulocytes: 0 %
Lymphocytes Absolute: 3 10*3/uL (ref 0.7–3.1)
Lymphs: 31 %
MCH: 31.7 pg (ref 26.6–33.0)
MCHC: 33.5 g/dL (ref 31.5–35.7)
MCV: 95 fL (ref 79–97)
Monocytes Absolute: 0.8 10*3/uL (ref 0.1–0.9)
Monocytes: 8 %
Neutrophils Absolute: 5.1 10*3/uL (ref 1.4–7.0)
Neutrophils: 54 %
RBC: 4.04 x10E6/uL (ref 3.77–5.28)
RDW: 13 % (ref 11.7–15.4)
WBC: 9.5 10*3/uL (ref 3.4–10.8)

## 2022-06-05 ENCOUNTER — Ambulatory Visit (INDEPENDENT_AMBULATORY_CARE_PROVIDER_SITE_OTHER): Payer: Medicare HMO | Admitting: Internal Medicine

## 2022-06-05 ENCOUNTER — Encounter: Payer: Self-pay | Admitting: Internal Medicine

## 2022-06-05 ENCOUNTER — Ambulatory Visit (INDEPENDENT_AMBULATORY_CARE_PROVIDER_SITE_OTHER): Payer: Medicare HMO

## 2022-06-05 VITALS — BP 110/62 | HR 76 | Ht 62.0 in | Wt 110.0 lb

## 2022-06-05 DIAGNOSIS — E782 Mixed hyperlipidemia: Secondary | ICD-10-CM | POA: Diagnosis not present

## 2022-06-05 DIAGNOSIS — I10 Essential (primary) hypertension: Secondary | ICD-10-CM

## 2022-06-05 DIAGNOSIS — J449 Chronic obstructive pulmonary disease, unspecified: Secondary | ICD-10-CM | POA: Diagnosis not present

## 2022-06-05 DIAGNOSIS — F411 Generalized anxiety disorder: Secondary | ICD-10-CM | POA: Diagnosis not present

## 2022-06-05 DIAGNOSIS — Z1231 Encounter for screening mammogram for malignant neoplasm of breast: Secondary | ICD-10-CM | POA: Diagnosis not present

## 2022-06-05 DIAGNOSIS — F17200 Nicotine dependence, unspecified, uncomplicated: Secondary | ICD-10-CM

## 2022-06-05 NOTE — Progress Notes (Signed)
Established Patient Office Visit  Subjective:  Patient ID: Leah Riley, female    DOB: 1955/09/27  Age: 67 y.o. MRN: [id number]  Chief Complaint  Patient presents with   Follow-up    10 day follow up    Patient comes in for follow-up today.  She has completed her prednisone and antibiotic.  She is feeling better however she has a chronic productive cough.  Patient admits to smoking half a pack per day.  Lungs sound clear today.  However we will check a chest x-Welp.   She does not have any fevers or chills, no nausea vomiting and no diarrhea. Her blood pressure looks better today as well. She is using her medications as well as her inhalers.  Advised to keep her appointment with her pulmonologist. Need to schedule her mammogram.    No other concerns at this time.   Past Medical History:  Diagnosis Date   COPD (chronic obstructive pulmonary disease) (HCC)    GERD (gastroesophageal reflux disease)    Headache    migraines   Hypertension     Past Surgical History:  Procedure Laterality Date   ABDOMINAL HYSTERECTOMY     APPENDECTOMY     BREAST CYST EXCISION Right    age 16   BREAST SURGERY Right    cyst removed   CYST EXCISION Left 07/28/2015   Procedure: CYST REMOVAL OF SUBCUTANEOUS LEFT KNEE;  Surgeon: Christena Flake, MD;  Location: [facility];  Service: Orthopedics;  Laterality: Left;   FOOT SURGERY Right    x3   KNEE ARTHROSCOPY Left    x2   OOPHORECTOMY     SHOULDER ARTHROSCOPY Right 05/26/2015   Procedure: SHOULDER RIGHT LIMITED ARTHROSCOPIC DEBRIDEMENT ARTHROSCOPIC SUBACROMIAL DECOMPRESSION MINI OPEN ROTATOR CUFF REPAIR MINI OPEN BICEPS TENODESIS;  Surgeon: Christena Flake, MD;  Location: [facility];  Service: Orthopedics;  Laterality: Right;   TUBAL LIGATION      Social History   Socioeconomic History   Marital status: Divorced    Spouse name: Not on file   Number of children: Not on file   Years of education: Not on file   Highest education  level: Not on file  Occupational History   Not on file  Tobacco Use   Smoking status: Every Day    Packs/day: 1.50    Years: 35.00    Additional pack years: 0.00    Total pack years: 52.50    Types: Cigarettes   Smokeless tobacco: Never   Tobacco comments:    0.5 pack a day   Substance and Sexual Activity   Alcohol use: Yes    Comment: occas - couple times per year   Drug use: Not on file   Sexual activity: Not on file  Other Topics Concern   Not on file  Social History Narrative   Not on file   Social Determinants of Health   Financial Resource Strain: Not on file  Food Insecurity: Not on file  Transportation Needs: Not on file  Physical Activity: Not on file  Stress: Not on file  Social Connections: Not on file  Intimate Partner Violence: Not on file    Family History  Problem Relation Age of Onset   Coronary artery disease Mother    Coronary artery disease Father     Allergies  Allergen Reactions   Gluten Meal Nausea And Vomiting    Itching, blistering   Sulfa Antibiotics Swelling    Rash, itching  Review of Systems  Constitutional:  Negative for chills, diaphoresis, fever, malaise/fatigue and weight loss.  HENT:  Negative for congestion, ear pain, hearing loss, sinus pain and sore throat.   Eyes: Negative.   Respiratory:  Positive for cough and sputum production. Negative for shortness of breath and wheezing.   Cardiovascular:  Negative for chest pain, palpitations, leg swelling and PND.  Gastrointestinal:  Negative for blood in stool, constipation, heartburn, nausea and vomiting.  Musculoskeletal:  Negative for falls, joint pain, myalgias and neck pain.  Skin: Negative.   Neurological:  Negative for dizziness, tingling, seizures, loss of consciousness, weakness and headaches.  Psychiatric/Behavioral:  Negative for depression. The patient is not nervous/anxious and does not have insomnia.        Objective:   BP 110/62   Pulse 76   Ht 5\' 2"   (1.575 m)   Wt 110 lb (49.9 kg)   SpO2 99%   BMI 20.12 kg/m   Vitals:   06/05/22 0902  BP: 110/62  Pulse: 76  Height: 5\' 2"  (1.575 m)  Weight: 110 lb (49.9 kg)  SpO2: 99%  BMI (Calculated): 20.11    Physical Exam Vitals and nursing note reviewed.  Constitutional:      Appearance: Normal appearance.  Cardiovascular:     Rate and Rhythm: Regular rhythm. Tachycardia present.     Pulses: Normal pulses.     Heart sounds: Normal heart sounds.  Pulmonary:     Effort: Pulmonary effort is normal. No respiratory distress.     Breath sounds: No wheezing, rhonchi or rales.  Chest:     Chest wall: No tenderness.  Abdominal:     General: Bowel sounds are normal. There is no distension.     Palpations: Abdomen is soft. There is no mass.     Tenderness: There is no abdominal tenderness. There is no right CVA tenderness or left CVA tenderness.  Musculoskeletal:        General: No swelling or tenderness.     Cervical back: Normal range of motion and neck supple.     Right lower leg: No edema.     Left lower leg: No edema.  Lymphadenopathy:     Cervical: No cervical adenopathy.  Skin:    General: Skin is warm and dry.  Neurological:     General: No focal deficit present.     Mental Status: She is alert and oriented to person, place, and time.  Psychiatric:        Mood and Affect: Mood normal.        Behavior: Behavior normal.      No results found for any visits on 06/05/22.  Recent Results (from the past 2160 hour(s))  CBC With Differential     Status: Abnormal   Collection Time: 05/25/22 11:30 AM  Result Value Ref Range   WBC 9.5 3.4 - 10.8 x10E3/uL   RBC 4.04 3.77 - 5.28 x10E6/uL   Hemoglobin 12.8 11.1 - 15.9 g/dL   Hematocrit 14.7 82.9 - 46.6 %   MCV 95 79 - 97 fL   MCH 31.7 26.6 - 33.0 pg   MCHC 33.5 31.5 - 35.7 g/dL   RDW 56.2 13.0 - 86.5 %   Neutrophils 54 Not Estab. %   Lymphs 31 Not Estab. %   Monocytes 8 Not Estab. %   Eos 6 Not Estab. %   Basos 1 Not  Estab. %   Neutrophils Absolute 5.1 1.4 - 7.0 x10E3/uL   Lymphocytes Absolute 3.0  0.7 - 3.1 x10E3/uL   Monocytes Absolute 0.8 0.1 - 0.9 x10E3/uL   EOS (ABSOLUTE) 0.6 (H) 0.0 - 0.4 x10E3/uL   Basophils Absolute 0.1 0.0 - 0.2 x10E3/uL   Immature Granulocytes 0 Not Estab. %   Immature Grans (Abs) 0.0 0.0 - 0.1 x10E3/uL  Lipid Panel w/o Chol/HDL Ratio     Status: Abnormal   Collection Time: 05/25/22 11:30 AM  Result Value Ref Range   Cholesterol, Total 181 100 - 199 mg/dL   Triglycerides 99 0 - 149 mg/dL   HDL 60 >16 mg/dL   VLDL Cholesterol Cal 18 5 - 40 mg/dL   LDL Chol Calc (NIH) 109 (H) 0 - 99 mg/dL  [id number]     Status: Abnormal   Collection Time: 05/25/22 11:30 AM  Result Value Ref Range   Glucose 86 70 - 99 mg/dL   BUN 14 8 - 27 mg/dL   Creatinine, Ser 1.91 0.57 - 1.00 mg/dL   eGFR 65 >47 WG/NFA/2.13   BUN/Creatinine Ratio 14 12 - 28   Sodium 132 (L) 134 - 144 mmol/L   Potassium 4.9 3.5 - 5.2 mmol/L   Chloride 95 (L) 96 - 106 mmol/L   CO2 23 20 - 29 mmol/L   Calcium 9.9 8.7 - 10.3 mg/dL   Total Protein 7.1 6.0 - 8.5 g/dL   Albumin 4.6 3.9 - 4.9 g/dL   Globulin, Total 2.5 1.5 - 4.5 g/dL   Albumin/Globulin Ratio 1.8 1.2 - 2.2   Bilirubin Total 0.4 0.0 - 1.2 mg/dL   Alkaline Phosphatase 106 44 - 121 IU/L   AST 20 0 - 40 IU/L   ALT 16 0 - 32 IU/L      Assessment & Plan:   Problem List Items Addressed This Visit     COPD, moderate (HCC) - Primary   Relevant Medications   TRELEGY ELLIPTA 100-62.5-25 MCG/ACT AEPB   Other Relevant Orders   DG Chest 2 View (Completed)   Nicotine dependence, uncomplicated   Mixed hyperlipidemia   Essential hypertension, benign   GAD (generalized anxiety disorder)   Other Visit Diagnoses     Breast cancer screening by mammogram       Relevant Orders   MM 3D SCREENING MAMMOGRAM BILATERAL BREAST       Return in about 6 weeks (around 07/17/2022).   Total time spent: 30 minutes  Margaretann Loveless, MD  06/05/2022   This  document may have been prepared by [facility] Voice Recognition software and as such may include unintentional dictation errors.

## 2022-06-30 ENCOUNTER — Other Ambulatory Visit: Payer: Self-pay | Admitting: Internal Medicine

## 2022-07-07 ENCOUNTER — Ambulatory Visit
Admission: RE | Admit: 2022-07-07 | Discharge: 2022-07-07 | Disposition: A | Discharge status: Home / Self Care | Payer: Medicare HMO | Source: Ambulatory Visit | Attending: Internal Medicine | Admitting: Internal Medicine

## 2022-07-07 DIAGNOSIS — Z1231 Encounter for screening mammogram for malignant neoplasm of breast: Secondary | ICD-10-CM | POA: Diagnosis not present

## 2022-07-18 ENCOUNTER — Ambulatory Visit: Payer: Medicare HMO | Admitting: Internal Medicine

## 2022-08-22 ENCOUNTER — Other Ambulatory Visit: Payer: Self-pay | Admitting: Internal Medicine

## 2022-08-22 DIAGNOSIS — M75121 Complete rotator cuff tear or rupture of right shoulder, not specified as traumatic: Secondary | ICD-10-CM

## 2022-08-24 ENCOUNTER — Other Ambulatory Visit: Payer: Self-pay | Admitting: Internal Medicine

## 2022-08-24 DIAGNOSIS — G8929 Other chronic pain: Secondary | ICD-10-CM

## 2022-10-10 ENCOUNTER — Encounter: Payer: Self-pay | Admitting: Internal Medicine

## 2022-10-10 ENCOUNTER — Ambulatory Visit
Admission: RE | Admit: 2022-10-10 | Discharge: 2022-10-10 | Disposition: A | Discharge status: Home / Self Care | Payer: Medicare HMO | Attending: Internal Medicine | Admitting: Internal Medicine

## 2022-10-10 ENCOUNTER — Ambulatory Visit (INDEPENDENT_AMBULATORY_CARE_PROVIDER_SITE_OTHER): Payer: Medicare HMO | Admitting: Internal Medicine

## 2022-10-10 ENCOUNTER — Ambulatory Visit
Admission: RE | Admit: 2022-10-10 | Discharge: 2022-10-10 | Disposition: A | Discharge status: Home / Self Care | Payer: Medicare HMO | Source: Ambulatory Visit | Attending: Internal Medicine | Admitting: Internal Medicine

## 2022-10-10 VITALS — BP 136/80 | HR 70 | Ht 62.0 in | Wt 115.0 lb

## 2022-10-10 DIAGNOSIS — J449 Chronic obstructive pulmonary disease, unspecified: Secondary | ICD-10-CM

## 2022-10-10 DIAGNOSIS — E782 Mixed hyperlipidemia: Secondary | ICD-10-CM | POA: Diagnosis not present

## 2022-10-10 DIAGNOSIS — F411 Generalized anxiety disorder: Secondary | ICD-10-CM | POA: Diagnosis not present

## 2022-10-10 DIAGNOSIS — M542 Cervicalgia: Secondary | ICD-10-CM | POA: Insufficient documentation

## 2022-10-10 DIAGNOSIS — K219 Gastro-esophageal reflux disease without esophagitis: Secondary | ICD-10-CM

## 2022-10-10 DIAGNOSIS — M5030 Other cervical disc degeneration, unspecified cervical region: Secondary | ICD-10-CM | POA: Diagnosis not present

## 2022-10-10 DIAGNOSIS — F17200 Nicotine dependence, unspecified, uncomplicated: Secondary | ICD-10-CM

## 2022-10-10 DIAGNOSIS — M4802 Spinal stenosis, cervical region: Secondary | ICD-10-CM | POA: Diagnosis not present

## 2022-10-10 DIAGNOSIS — M47812 Spondylosis without myelopathy or radiculopathy, cervical region: Secondary | ICD-10-CM | POA: Diagnosis not present

## 2022-10-10 DIAGNOSIS — I10 Essential (primary) hypertension: Secondary | ICD-10-CM | POA: Diagnosis not present

## 2022-10-10 NOTE — Progress Notes (Signed)
Established Patient Office Visit  Subjective:  Patient ID: Leah Riley, female    DOB: [birth year]  Age: 67 y.o. MRN: [id number]  Chief Complaint  Patient presents with   Neck Pain    Started a week ago, worsened over the weekend.    Patient comes in for her follow-up and also complains of neck pain.  Her COPD is currently stable and she is not coughing too much although she continues to smoke.  However she noticed for the last few weeks her neck pain is getting worse she does sit with a bent neck posture.  There is no radiation of pain down her shoulder or her arms, and there is no tingling and numbness of her hands and fingertips. She already has meloxicam and baclofen at home that she can take but it did not help her much. Will get an x-Hausmann of her C-spine and advised to wear a soft collar during the day. May need to get an orthopedic consult. Patient is also fasting for blood work today.    No other concerns at this time.   Past Medical History:  Diagnosis Date   COPD (chronic obstructive pulmonary disease) (HCC)    GERD (gastroesophageal reflux disease)    Headache    migraines   Hypertension     Past Surgical History:  Procedure Laterality Date   ABDOMINAL HYSTERECTOMY     APPENDECTOMY     BREAST CYST EXCISION Right    age 71   BREAST SURGERY Right    cyst removed   CYST EXCISION Left 07/28/2015   Procedure: CYST REMOVAL OF SUBCUTANEOUS LEFT KNEE;  Surgeon: Christena Flake, MD;  Location: [facility];  Service: Orthopedics;  Laterality: Left;   FOOT SURGERY Right    x3   KNEE ARTHROSCOPY Left    x2   OOPHORECTOMY     SHOULDER ARTHROSCOPY Right 05/26/2015   Procedure: SHOULDER RIGHT LIMITED ARTHROSCOPIC DEBRIDEMENT ARTHROSCOPIC SUBACROMIAL DECOMPRESSION MINI OPEN ROTATOR CUFF REPAIR MINI OPEN BICEPS TENODESIS;  Surgeon: Christena Flake, MD;  Location: [facility];  Service: Orthopedics;  Laterality: Right;   TUBAL LIGATION      Social History    Socioeconomic History   Marital status: Divorced    Spouse name: Not on file   Number of children: Not on file   Years of education: Not on file   Highest education level: Not on file  Occupational History   Not on file  Tobacco Use   Smoking status: Every Day    Current packs/day: 1.50    Average packs/day: 1.5 packs/day for 35.0 years (52.5 ttl pk-yrs)    Types: Cigarettes   Smokeless tobacco: Never   Tobacco comments:    0.5 pack a day   Substance and Sexual Activity   Alcohol use: Yes    Comment: occas - couple times per year   Drug use: Not on file   Sexual activity: Not on file  Other Topics Concern   Not on file  Social History Narrative   Not on file   Social Determinants of Health   Financial Resource Strain: Not on file  Food Insecurity: Not on file  Transportation Needs: Not on file  Physical Activity: Not on file  Stress: Not on file  Social Connections: Not on file  Intimate Partner Violence: Not on file    Family History  Problem Relation Age of Onset   Coronary artery disease Mother    Coronary artery disease Father  Allergies  Allergen Reactions   Gluten Meal Nausea And Vomiting    Itching, blistering   Sulfa Antibiotics Swelling    Rash, itching    Review of Systems  Constitutional:  Positive for malaise/fatigue. Negative for chills, fever and weight loss.  HENT: Negative.  Negative for ear pain and sore throat.   Eyes: Negative.   Respiratory:  Positive for cough. Negative for hemoptysis, sputum production, shortness of breath and wheezing.   Cardiovascular: Negative.  Negative for chest pain, palpitations and leg swelling.  Gastrointestinal: Negative.  Negative for abdominal pain, constipation, diarrhea, heartburn, nausea and vomiting.  Genitourinary: Negative.  Negative for dysuria and flank pain.  Musculoskeletal:  Positive for neck pain. Negative for joint pain and myalgias.  Skin: Negative.   Neurological: Negative.  Negative  for dizziness, tingling, tremors, sensory change and headaches.  Endo/Heme/Allergies: Negative.   Psychiatric/Behavioral: Negative.  Negative for depression and suicidal ideas. The patient is not nervous/anxious.        Objective:   BP 136/80   Pulse 70   Ht 5\' 2"  (1.575 m)   Wt 115 lb (52.2 kg)   SpO2 99%   BMI 21.03 kg/m   Vitals:   10/10/22 0931  BP: 136/80  Pulse: 70  Height: 5\' 2"  (1.575 m)  Weight: 115 lb (52.2 kg)  SpO2: 99%  BMI (Calculated): 21.03    Physical Exam Vitals and nursing note reviewed.  Constitutional:      Appearance: Normal appearance.  HENT:     Head: Normocephalic and atraumatic.     Nose: Nose normal.     Mouth/Throat:     Mouth: Mucous membranes are moist.     Pharynx: Oropharynx is clear.  Eyes:     Conjunctiva/sclera: Conjunctivae normal.     Pupils: Pupils are equal, round, and reactive to light.  Cardiovascular:     Rate and Rhythm: Normal rate and regular rhythm.     Pulses: Normal pulses.     Heart sounds: Normal heart sounds. No murmur heard. Pulmonary:     Effort: Pulmonary effort is normal.     Breath sounds: Normal breath sounds. No wheezing.  Abdominal:     General: Bowel sounds are normal.     Palpations: Abdomen is soft.     Tenderness: There is no abdominal tenderness. There is no right CVA tenderness or left CVA tenderness.  Musculoskeletal:        General: Normal range of motion.     Cervical back: Normal range of motion.     Right lower leg: No edema.     Left lower leg: No edema.  Skin:    General: Skin is warm and dry.  Neurological:     General: No focal deficit present.     Mental Status: She is alert and oriented to person, place, and time.  Psychiatric:        Mood and Affect: Mood normal.        Behavior: Behavior normal.      No results found for any visits on 10/10/22.  No results found for this or any previous visit (from the past 2160 hour(s)).    Assessment & Plan:  Check x-Lantz  C-spine. Continue meloxicam and baclofen.  Try soft cervical collar. Fasting labs today. Follow-up to discuss results. Consider PT/Ortho consult Problem List Items Addressed This Visit     COPD, moderate (HCC)   GERD (gastroesophageal reflux disease)   Relevant Orders   CBC with Diff  Nicotine dependence, uncomplicated   Mixed hyperlipidemia   Relevant Orders   Lipid Panel w/o Chol/HDL Ratio   Essential hypertension, benign   Relevant Orders   CMP14+EGFR   GAD (generalized anxiety disorder)   Other Visit Diagnoses     Cervicalgia    -  Primary   Relevant Orders   DG Cervical Spine Complete       Return in about 2 weeks (around 10/24/2022).   Total time spent: 30 minutes  Margaretann Loveless, MD  10/10/2022   This document may have been prepared by [facility] Voice Recognition software and as such may include unintentional dictation errors.

## 2022-10-11 ENCOUNTER — Other Ambulatory Visit: Payer: Self-pay | Admitting: Internal Medicine

## 2022-10-11 ENCOUNTER — Other Ambulatory Visit: Payer: Self-pay | Admitting: Family

## 2022-10-11 LAB — CMP14+EGFR
ALT: 17 [IU]/L (ref 0–32)
AST: 32 [IU]/L (ref 0–40)
Albumin: 4.5 g/dL (ref 3.9–4.9)
Alkaline Phosphatase: 99 [IU]/L (ref 44–121)
BUN/Creatinine Ratio: 15 (ref 12–28)
BUN: 18 mg/dL (ref 8–27)
Bilirubin Total: 0.4 mg/dL (ref 0.0–1.2)
CO2: 20 mmol/L (ref 20–29)
Calcium: 10.4 mg/dL — ABNORMAL HIGH (ref 8.7–10.3)
Chloride: 100 mmol/L (ref 96–106)
Creatinine, Ser: 1.21 mg/dL — ABNORMAL HIGH (ref 0.57–1.00)
Globulin, Total: 2.9 g/dL (ref 1.5–4.5)
Glucose: 92 mg/dL (ref 70–99)
Potassium: 5.3 mmol/L — ABNORMAL HIGH (ref 3.5–5.2)
Sodium: 137 mmol/L (ref 134–144)
Total Protein: 7.4 g/dL (ref 6.0–8.5)
eGFR: 50 mL/min/{1.73_m2} — ABNORMAL LOW (ref >59)

## 2022-10-11 LAB — CBC WITH DIFFERENTIAL/PLATELET
Basophils Absolute: 0.1 10*3/uL (ref 0.0–0.2)
Basos: 1 %
EOS (ABSOLUTE): 0.4 10*3/uL (ref 0.0–0.4)
Eos: 4 %
Hematocrit: 39.4 % (ref 34.0–46.6)
Hemoglobin: 12.7 g/dL (ref 11.1–15.9)
Immature Grans (Abs): 0 10*3/uL (ref 0.0–0.1)
Immature Granulocytes: 0 %
Lymphocytes Absolute: 2.8 10*3/uL (ref 0.7–3.1)
Lymphs: 30 %
MCH: 32.6 pg (ref 26.6–33.0)
MCHC: 32.2 g/dL (ref 31.5–35.7)
MCV: 101 fL — ABNORMAL HIGH (ref 79–97)
Monocytes Absolute: 0.7 10*3/uL (ref 0.1–0.9)
Monocytes: 7 %
Neutrophils Absolute: 5.6 10*3/uL (ref 1.4–7.0)
Neutrophils: 58 %
Platelets: 263 10*3/uL (ref 150–450)
RBC: 3.9 x10E6/uL (ref 3.77–5.28)
RDW: 12.2 % (ref 11.7–15.4)
WBC: 9.5 10*3/uL (ref 3.4–10.8)

## 2022-10-11 LAB — LIPID PANEL W/O CHOL/HDL RATIO
Cholesterol, Total: 131 mg/dL (ref 100–199)
HDL: 60 mg/dL (ref >39)
LDL Chol Calc (NIH): 53 mg/dL (ref 0–99)
Triglycerides: 99 mg/dL (ref 0–149)
VLDL Cholesterol Cal: 18 mg/dL (ref 5–40)

## 2022-10-12 MED ORDER — PANTOPRAZOLE SODIUM 40 MG PO TBEC: 40.0000 mg | DELAYED_RELEASE_TABLET | Freq: Every day | ORAL | 3 refills | Status: DC

## 2022-10-13 ENCOUNTER — Other Ambulatory Visit: Payer: Self-pay | Admitting: Family

## 2022-10-25 ENCOUNTER — Ambulatory Visit (INDEPENDENT_AMBULATORY_CARE_PROVIDER_SITE_OTHER): Payer: Medicare HMO | Admitting: Family

## 2022-10-25 DIAGNOSIS — R300 Dysuria: Secondary | ICD-10-CM

## 2022-10-25 DIAGNOSIS — N390 Urinary tract infection, site not specified: Secondary | ICD-10-CM | POA: Diagnosis not present

## 2022-10-25 LAB — POCT URINALYSIS DIPSTICK
Bilirubin, UA: NEGATIVE
Glucose, UA: NEGATIVE
Ketones, UA: NEGATIVE
Nitrite, UA: NEGATIVE
Protein, UA: NEGATIVE
Spec Grav, UA: <=1.005 — AB (ref 1.010–1.025)
Urobilinogen, UA: NEGATIVE U/dL — AB
pH, UA: 7 (ref 5.0–8.0)

## 2022-10-25 MED ORDER — CIPROFLOXACIN HCL 500 MG PO TABS
500.0000 mg | ORAL_TABLET | Freq: Two times a day (BID) | ORAL | 0 refills | Status: AC
Start: 2022-10-25 — End: 2022-11-01

## 2022-10-25 NOTE — Progress Notes (Signed)
   CHIEF COMPLAINT  UA/ only visit fot UTI     REASON FOR VISIT  Possible UTI, UA Visit Only      ASSESSMENT & PLAN Diagnoses and all orders for this visit:  Urinary tract infection without hematuria, site unspecified -     Urinalysis, Routine w reflex microscopic -     Urine Culture -     ciprofloxacin (CIPRO) 500 MG tablet; Take 1 tablet (500 mg total) by mouth 2 (two) times daily for 7 days.  Dysuria -     POCT Urinalysis Dipstick (81002) -     Urinalysis, Routine w reflex microscopic -     Urine Culture -     ciprofloxacin (CIPRO) 500 MG tablet; Take 1 tablet (500 mg total) by mouth 2 (two) times daily for 7 days.     Patient notified.  Total time spent: 10 minutes  Miki Kins, FNP 10/25/2022

## 2022-10-26 LAB — URINALYSIS, ROUTINE W REFLEX MICROSCOPIC
Bilirubin, UA: NEGATIVE
Glucose, UA: NEGATIVE
Ketones, UA: NEGATIVE
Nitrite, UA: NEGATIVE
Protein,UA: NEGATIVE
Specific Gravity, UA: 1.007 (ref 1.005–1.030)
Urobilinogen, Ur: 0.2 mg/dL (ref 0.2–1.0)
pH, UA: 7 (ref 5.0–7.5)

## 2022-10-26 LAB — MICROSCOPIC EXAMINATION
Bacteria, UA: NONE SEEN
Casts: NONE SEEN /[LPF]
WBC, UA: >30 /[HPF] — AB (ref 0–5)

## 2022-10-28 LAB — URINE CULTURE

## 2022-11-02 ENCOUNTER — Other Ambulatory Visit: Payer: Self-pay | Admitting: Medical Genetics

## 2022-11-02 DIAGNOSIS — Z006 Encounter for examination for normal comparison and control in clinical research program: Secondary | ICD-10-CM

## 2022-11-06 ENCOUNTER — Ambulatory Visit (INDEPENDENT_AMBULATORY_CARE_PROVIDER_SITE_OTHER): Payer: Medicare HMO | Admitting: Internal Medicine

## 2022-11-06 ENCOUNTER — Encounter: Payer: Self-pay | Admitting: Internal Medicine

## 2022-11-06 ENCOUNTER — Other Ambulatory Visit: Payer: Self-pay | Admitting: Family

## 2022-11-06 VITALS — BP 138/76 | HR 83 | Ht 62.0 in | Wt 117.2 lb

## 2022-11-06 DIAGNOSIS — M542 Cervicalgia: Secondary | ICD-10-CM | POA: Diagnosis not present

## 2022-11-06 DIAGNOSIS — E782 Mixed hyperlipidemia: Secondary | ICD-10-CM

## 2022-11-06 DIAGNOSIS — F17200 Nicotine dependence, unspecified, uncomplicated: Secondary | ICD-10-CM | POA: Diagnosis not present

## 2022-11-06 DIAGNOSIS — J301 Allergic rhinitis due to pollen: Secondary | ICD-10-CM | POA: Diagnosis not present

## 2022-11-06 DIAGNOSIS — I10 Essential (primary) hypertension: Secondary | ICD-10-CM

## 2022-11-06 DIAGNOSIS — J449 Chronic obstructive pulmonary disease, unspecified: Secondary | ICD-10-CM

## 2022-11-06 NOTE — Progress Notes (Signed)
Established Patient Office Visit  Subjective:  Patient ID: Leah Riley, female    DOB: May 09, 1955  Age: 67 y.o. MRN: [id number]  Chief Complaint  Patient presents with   Follow-up    2 week follow up    Patient comes in for follow-up of her neck pain.  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  She is feeling better today and tries to use her soft cervical collar intermittently throughout the day.  Patient advised to improve her posture. Today she is mentioning some postnasal drip and clear runny nasal discharge due to allergies.  No cough or chest congestion and no fevers or chills.  Advised to continue taking her allergy medications and the Flonase nasal spray regularly.    No other concerns at this time.   Past Medical History:  Diagnosis Date   COPD (chronic obstructive pulmonary disease) (HCC)    GERD (gastroesophageal reflux disease)    Headache    migraines   Hypertension     Past Surgical History:  Procedure Laterality Date   ABDOMINAL HYSTERECTOMY     APPENDECTOMY     BREAST CYST EXCISION Right    age 89   BREAST SURGERY Right    cyst removed   CYST EXCISION Left 07/28/2015   Procedure: CYST REMOVAL OF SUBCUTANEOUS LEFT KNEE;  Surgeon: Christena Flake, MD;  Location: [facility];  Service: Orthopedics;  Laterality: Left;   FOOT SURGERY Right    x3   KNEE ARTHROSCOPY Left    x2   OOPHORECTOMY     SHOULDER ARTHROSCOPY Right 05/26/2015   Procedure: SHOULDER RIGHT LIMITED ARTHROSCOPIC DEBRIDEMENT ARTHROSCOPIC SUBACROMIAL DECOMPRESSION MINI OPEN ROTATOR CUFF REPAIR MINI OPEN BICEPS TENODESIS;  Surgeon: Christena Flake, MD;  Location: [facility];  Service: Orthopedics;  Laterality: Right;   TUBAL LIGATION      Social History   Socioeconomic History   Marital status: Divorced    Spouse name: Not on file   Number of children: Not on file   Years of education: Not on file   Highest education level: Not on file  Occupational History    Not on file  Tobacco Use   Smoking status: Every Day    Current packs/day: 1.50    Average packs/day: 1.5 packs/day for 35.0 years (52.5 ttl pk-yrs)    Types: Cigarettes   Smokeless tobacco: Never   Tobacco comments:    0.5 pack a day   Substance and Sexual Activity   Alcohol use: Yes    Comment: occas - couple times per year   Drug use: Not on file   Sexual activity: Not on file  Other Topics Concern   Not on file  Social History Narrative   Not on file   Social Determinants of Health   Financial Resource Strain: Not on file  Food Insecurity: Not on file  Transportation Needs: Not on file  Physical Activity: Not on file  Stress: Not on file  Social Connections: Not on file  Intimate Partner Violence: Not on file    Family History  Problem Relation Age of Onset   Coronary artery disease Mother    Coronary artery disease Father     Allergies  Allergen Reactions   Gluten Meal Nausea And Vomiting    Itching, blistering   Sulfa Antibiotics Swelling    Rash, itching    Review of Systems  Constitutional: Negative.  Negative for diaphoresis, fever and malaise/fatigue.  HENT: Negative.  Negative for congestion, hearing loss and sore throat.   Eyes: Negative.   Respiratory: Negative.  Negative for cough, shortness of breath and stridor.   Cardiovascular: Negative.  Negative for chest pain, palpitations and leg swelling.  Gastrointestinal: Negative.  Negative for abdominal pain, constipation, diarrhea, heartburn, nausea and vomiting.  Genitourinary: Negative.  Negative for dysuria and flank pain.  Musculoskeletal: Negative.  Negative for joint pain and myalgias.  Skin: Negative.   Neurological: Negative.  Negative for dizziness and headaches.  Endo/Heme/Allergies: Negative.   Psychiatric/Behavioral: Negative.  Negative for depression and suicidal ideas. The patient is not nervous/anxious.        Objective:   BP 138/76   Pulse 83   Ht 5\' 2"  (1.575 m)   Wt 117  lb 3.2 oz (53.2 kg)   SpO2 (!) 88%   BMI 21.44 kg/m   Vitals:   11/06/22 1011  BP: 138/76  Pulse: 83  Height: 5\' 2"  (1.575 m)  Weight: 117 lb 3.2 oz (53.2 kg)  SpO2: (!) 88%  BMI (Calculated): 21.43    Physical Exam Vitals and nursing note reviewed.  Constitutional:      Appearance: Normal appearance.  HENT:     Head: Normocephalic and atraumatic.     Nose: Nose normal.     Mouth/Throat:     Mouth: Mucous membranes are moist.     Pharynx: Oropharynx is clear.  Eyes:     Conjunctiva/sclera: Conjunctivae normal.     Pupils: Pupils are equal, round, and reactive to light.  Cardiovascular:     Rate and Rhythm: Normal rate and regular rhythm.     Pulses: Normal pulses.     Heart sounds: Normal heart sounds. No murmur heard. Pulmonary:     Effort: Pulmonary effort is normal.     Breath sounds: Normal breath sounds. No wheezing.  Abdominal:     General: Bowel sounds are normal.     Palpations: Abdomen is soft.     Tenderness: There is no abdominal tenderness. There is no right CVA tenderness or left CVA tenderness.  Musculoskeletal:        General: Normal range of motion.     Cervical back: Normal range of motion.     Right lower leg: No edema.     Left lower leg: No edema.  Skin:    General: Skin is warm and dry.  Neurological:     General: No focal deficit present.     Mental Status: She is alert and oriented to person, place, and time.  Psychiatric:        Mood and Affect: Mood normal.        Behavior: Behavior normal.      No results found for any visits on 11/06/22.  Recent Results (from the past 2160 hour(s))  CBC with Diff     Status: Abnormal   Collection Time: 10/10/22 10:29 AM  Result Value Ref Range   WBC 9.5 3.4 - 10.8 x10E3/uL   RBC 3.90 3.77 - 5.28 x10E6/uL   Hemoglobin 12.7 11.1 - 15.9 g/dL   Hematocrit 95.6 21.3 - 46.6 %   MCV 101 (H) 79 - 97 fL   MCH 32.6 26.6 - 33.0 pg   MCHC 32.2 31.5 - 35.7 g/dL   RDW 08.6 57.8 - 46.9 %   Platelets  263 150 - 450 x10E3/uL   Neutrophils 58 Not Estab. %   Lymphs 30 Not Estab. %   Monocytes 7 Not Estab. %  Eos 4 Not Estab. %   Basos 1 Not Estab. %   Neutrophils Absolute 5.6 1.4 - 7.0 x10E3/uL   Lymphocytes Absolute 2.8 0.7 - 3.1 x10E3/uL   Monocytes Absolute 0.7 0.1 - 0.9 x10E3/uL   EOS (ABSOLUTE) 0.4 0.0 - 0.4 x10E3/uL   Basophils Absolute 0.1 0.0 - 0.2 x10E3/uL   Immature Granulocytes 0 Not Estab. %   Immature Grans (Abs) 0.0 0.0 - 0.1 x10E3/uL  CMP14+EGFR     Status: Abnormal   Collection Time: 10/10/22 10:29 AM  Result Value Ref Range   Glucose 92 70 - 99 mg/dL   BUN 18 8 - 27 mg/dL   Creatinine, Ser 2.13 (H) 0.57 - 1.00 mg/dL   eGFR 50 (L) >08 MV/HQI/6.96   BUN/Creatinine Ratio 15 12 - 28   Sodium 137 134 - 144 mmol/L   Potassium 5.3 (H) 3.5 - 5.2 mmol/L   Chloride 100 96 - 106 mmol/L   CO2 20 20 - 29 mmol/L   Calcium 10.4 (H) 8.7 - 10.3 mg/dL   Total Protein 7.4 6.0 - 8.5 g/dL   Albumin 4.5 3.9 - 4.9 g/dL   Globulin, Total 2.9 1.5 - 4.5 g/dL   Bilirubin Total 0.4 0.0 - 1.2 mg/dL   Alkaline Phosphatase 99 44 - 121 IU/L   AST 32 0 - 40 IU/L   ALT 17 0 - 32 IU/L  Lipid Panel w/o Chol/HDL Ratio     Status: None   Collection Time: 10/10/22 10:29 AM  Result Value Ref Range   Cholesterol, Total 131 100 - 199 mg/dL   Triglycerides 99 0 - 149 mg/dL   HDL 60 >29 mg/dL   VLDL Cholesterol Cal 18 5 - 40 mg/dL   LDL Chol Calc (NIH) 53 0 - 99 mg/dL  POCT Urinalysis Dipstick (52841)     Status: Abnormal   Collection Time: 10/25/22 10:10 AM  Result Value Ref Range   Color, UA Yellow    Clarity, UA Cloudy    Glucose, UA Negative Negative   Bilirubin, UA Negative    Ketones, UA Negative    Spec Grav, UA <=1.005 (A) 1.010 - 1.025   Blood, UA 3+    pH, UA 7.0 5.0 - 8.0   Protein, UA Negative Negative   Urobilinogen, UA negative (A) 0.2 or 1.0 E.U./dL   Nitrite, UA Negative    Leukocytes, UA Large (3+) (A) Negative   Appearance Cloudy    Odor YES   Urine Culture      Status: Abnormal   Collection Time: 10/25/22 10:37 AM   Specimen: Urine, Clean Catch   UC  Result Value Ref Range   Urine Culture, Routine Final report (A)    Organism ID, Bacteria Escherichia coli (A)     Comment: Multi-Drug Resistant Organism Susceptibility profile is consistent with a probable ESBL. 25,000-50,000 colony forming units per mL    Antimicrobial Susceptibility Comment     Comment:       ** S = Susceptible; I = Intermediate; R = Resistant **                    P = Positive; N = Negative             MICS are expressed in micrograms per mL    Antibiotic                 RSLT#1    RSLT#2    RSLT#3    RSLT#4 Amoxicillin/Clavulanic Acid  R Ampicillin                     R Cefazolin                      R Cefepime                       S Ceftriaxone                    R Cefuroxime                     I Ciprofloxacin                  S Ertapenem                      S Gentamicin                     S Imipenem                       S Levofloxacin                   S Meropenem                      S Nitrofurantoin                 S Piperacillin/Tazobactam        S Tetracycline                   S Tobramycin                     S Trimethoprim/Sulfa             R   Urinalysis, Routine w reflex microscopic     Status: Abnormal   Collection Time: 10/25/22 10:39 AM  Result Value Ref Range   Specific Gravity, UA 1.007 1.005 - 1.030   pH, UA 7.0 5.0 - 7.5   Color, UA Yellow Yellow   Appearance Ur Cloudy (A) Clear   Leukocytes,UA 3+ (A) Negative   Protein,UA Negative Negative/Trace   Glucose, UA Negative Negative   Ketones, UA Negative Negative   RBC, UA 2+ (A) Negative   Bilirubin, UA Negative Negative   Urobilinogen, Ur 0.2 0.2 - 1.0 mg/dL   Nitrite, UA Negative Negative   Microscopic Examination See below:     Comment: Microscopic was indicated and was performed.  Microscopic Examination     Status: Abnormal   Collection Time: 10/25/22 10:39 AM  Result Value Ref  Range   WBC, UA >30 (A) 0 - 5 /hpf   RBC, Urine 11-30 (A) 0 - 2 /hpf   Epithelial Cells (non renal) 0-10 0 - 10 /hpf   Casts None seen None seen /lpf   Bacteria, UA None seen None seen/Few      Assessment & Plan:  Patient will continue her medications. Need to repeat her potassium and creatinine today. Problem List Items Addressed This Visit     COPD, moderate (HCC)   Nicotine dependence, uncomplicated   Mixed hyperlipidemia   Essential hypertension, benign - Primary   Relevant Orders   Basic metabolic panel   Seasonal allergic rhinitis due to pollen   Cervicalgia    Return in about 6 weeks (around 12/18/2022).  Total time spent: 30 minutes  Margaretann Loveless, MD  11/06/2022   This document may have been prepared by [facility] Voice Recognition software and as such may include unintentional dictation errors.

## 2022-11-07 LAB — BASIC METABOLIC PANEL
BUN/Creatinine Ratio: 16 (ref 12–28)
BUN: 18 mg/dL (ref 8–27)
CO2: 20 mmol/L (ref 20–29)
Calcium: 9.9 mg/dL (ref 8.7–10.3)
Chloride: 91 mmol/L — ABNORMAL LOW (ref 96–106)
Creatinine, Ser: 1.14 mg/dL — ABNORMAL HIGH (ref 0.57–1.00)
Glucose: 91 mg/dL (ref 70–99)
Potassium: 5 mmol/L (ref 3.5–5.2)
Sodium: 130 mmol/L — ABNORMAL LOW (ref 134–144)
eGFR: 53 mL/min/{1.73_m2} — ABNORMAL LOW (ref >59)

## 2022-11-07 NOTE — Progress Notes (Signed)
Patient notified

## 2022-11-13 ENCOUNTER — Other Ambulatory Visit
Admission: RE | Admit: 2022-11-13 | Discharge: 2022-11-13 | Disposition: A | Discharge status: Home / Self Care | Payer: Medicare HMO | Source: Ambulatory Visit | Attending: Medical Genetics | Admitting: Medical Genetics

## 2022-11-13 DIAGNOSIS — Z006 Encounter for examination for normal comparison and control in clinical research program: Secondary | ICD-10-CM | POA: Insufficient documentation

## 2022-11-18 ENCOUNTER — Other Ambulatory Visit: Payer: Self-pay | Admitting: Internal Medicine

## 2022-11-18 DIAGNOSIS — M75121 Complete rotator cuff tear or rupture of right shoulder, not specified as traumatic: Secondary | ICD-10-CM

## 2022-11-21 LAB — GENECONNECT MOLECULAR SCREEN

## 2022-11-21 LAB — HELIX MOLECULAR SCREEN: Genetic Analysis Overall Interpretation: NEGATIVE

## 2022-11-22 ENCOUNTER — Other Ambulatory Visit: Payer: Self-pay | Admitting: Internal Medicine

## 2022-11-22 DIAGNOSIS — J449 Chronic obstructive pulmonary disease, unspecified: Secondary | ICD-10-CM

## 2022-11-23 ENCOUNTER — Other Ambulatory Visit: Payer: Self-pay

## 2022-11-23 MED ORDER — TRELEGY ELLIPTA 100-62.5-25 MCG/ACT IN AEPB
1.0000 | INHALATION_SPRAY | Freq: Every day | RESPIRATORY_TRACT | 6 refills | Status: AC
  Filled 2022-11-23: qty 60, 30d supply, fill #0
  Filled 2022-12-18: qty 60, 30d supply, fill #1
  Filled 2023-04-30: qty 60, 30d supply, fill #2

## 2022-12-01 ENCOUNTER — Ambulatory Visit: Payer: Medicare HMO | Admitting: Internal Medicine

## 2022-12-07 ENCOUNTER — Ambulatory Visit: Payer: Medicare HMO | Admitting: Internal Medicine

## 2022-12-07 ENCOUNTER — Encounter: Payer: Self-pay | Admitting: Internal Medicine

## 2022-12-07 VITALS — BP 146/78 | HR 72 | Temp 96.6°F | Ht 62.0 in | Wt 116.4 lb

## 2022-12-07 DIAGNOSIS — K219 Gastro-esophageal reflux disease without esophagitis: Secondary | ICD-10-CM

## 2022-12-07 DIAGNOSIS — J440 Chronic obstructive pulmonary disease with acute lower respiratory infection: Secondary | ICD-10-CM | POA: Diagnosis not present

## 2022-12-07 DIAGNOSIS — M542 Cervicalgia: Secondary | ICD-10-CM | POA: Diagnosis not present

## 2022-12-07 DIAGNOSIS — I10 Essential (primary) hypertension: Secondary | ICD-10-CM | POA: Diagnosis not present

## 2022-12-07 DIAGNOSIS — J40 Bronchitis, not specified as acute or chronic: Secondary | ICD-10-CM

## 2022-12-07 DIAGNOSIS — J449 Chronic obstructive pulmonary disease, unspecified: Secondary | ICD-10-CM | POA: Diagnosis not present

## 2022-12-07 LAB — POCT XPERT XPRESS SARS COVID-2/FLU/RSV
FLU A: NEGATIVE
FLU B: NEGATIVE
RSV RNA, PCR: NEGATIVE
SARS Coronavirus 2: NEGATIVE

## 2022-12-07 MED ORDER — AMOXICILLIN-POT CLAVULANATE 875-125 MG PO TABS: 1.0000 | ORAL_TABLET | Freq: Two times a day (BID) | ORAL | 0 refills | Status: DC

## 2022-12-07 MED ORDER — PREDNISONE 20 MG PO TABS
40.0000 mg | ORAL_TABLET | Freq: Every day | ORAL | 0 refills | Status: DC
Start: 2022-12-07 — End: 2022-12-19

## 2022-12-07 NOTE — Progress Notes (Signed)
Established Patient Office Visit  Subjective:  Patient ID: Leah Riley, female    DOB: December 02, 1955  Age: 67 y.o. MRN: [id number]  Chief Complaint  Patient presents with   Acute Visit    Cough, fever, upset stomach    Patient comes in with 2 week history of cough, chest congestion, fatigue and stomach upset with nausea. Home Covid test negative last today. Repeat test today today also neg- Will start Prednisone burst, Augmentin- continue inhalers.    No other concerns at this time.   Past Medical History:  Diagnosis Date   COPD (chronic obstructive pulmonary disease) (HCC)    GERD (gastroesophageal reflux disease)    Headache    migraines   Hypertension     Past Surgical History:  Procedure Laterality Date   ABDOMINAL HYSTERECTOMY     APPENDECTOMY     BREAST CYST EXCISION Right    age 29   BREAST SURGERY Right    cyst removed   CYST EXCISION Left 07/28/2015   Procedure: CYST REMOVAL OF SUBCUTANEOUS LEFT KNEE;  Surgeon: Christena Flake, MD;  Location: [facility];  Service: Orthopedics;  Laterality: Left;   FOOT SURGERY Right    x3   KNEE ARTHROSCOPY Left    x2   OOPHORECTOMY     SHOULDER ARTHROSCOPY Right 05/26/2015   Procedure: SHOULDER RIGHT LIMITED ARTHROSCOPIC DEBRIDEMENT ARTHROSCOPIC SUBACROMIAL DECOMPRESSION MINI OPEN ROTATOR CUFF REPAIR MINI OPEN BICEPS TENODESIS;  Surgeon: Christena Flake, MD;  Location: [facility];  Service: Orthopedics;  Laterality: Right;   TUBAL LIGATION      Social History   Socioeconomic History   Marital status: Divorced    Spouse name: Not on file   Number of children: Not on file   Years of education: Not on file   Highest education level: Not on file  Occupational History   Not on file  Tobacco Use   Smoking status: Every Day    Current packs/day: 1.50    Average packs/day: 1.5 packs/day for 35.0 years (52.5 ttl pk-yrs)    Types: Cigarettes   Smokeless tobacco: Never   Tobacco comments:    0.5 pack a day    Substance and Sexual Activity   Alcohol use: Yes    Comment: occas - couple times per year   Drug use: Not on file   Sexual activity: Not on file  Other Topics Concern   Not on file  Social History Narrative   Not on file   Social Determinants of Health   Financial Resource Strain: Not on file  Food Insecurity: Not on file  Transportation Needs: Not on file  Physical Activity: Not on file  Stress: Not on file  Social Connections: Not on file  Intimate Partner Violence: Not on file    Family History  Problem Relation Age of Onset   Coronary artery disease Mother    Coronary artery disease Father     Allergies  Allergen Reactions   Gluten Meal Nausea And Vomiting    Itching, blistering   Sulfa Antibiotics Swelling    Rash, itching    Outpatient Medications Prior to Visit  Medication Sig   alendronate (FOSAMAX) 70 MG tablet Take 70 mg by mouth once a week.   amLODipine (NORVASC) 5 MG tablet Take 1 tablet (5 mg total) by mouth daily.   aspirin 81 MG tablet Take 81 mg by mouth daily.   atorvastatin (LIPITOR) 20 MG tablet Take 1 tablet (20 mg total)  by mouth daily.   baclofen (LIORESAL) 10 MG tablet    buPROPion (WELLBUTRIN XL) 150 MG 24 hr tablet TAKE 1 TABLET BY MOUTH IN THE MORNING   Calcium Carbonate-Vitamin D (CALTRATE 600+D PO) Take by mouth 2 (two) times daily.   estradiol (CLIMARA - DOSED IN MG/24 HR) 0.1 mg/24hr patch APPLY 1 PATCH TOPICALLY ONCE A WEEK   fluticasone (FLONASE) 50 MCG/ACT nasal spray Place into both nostrils daily.   gabapentin (NEURONTIN) 300 MG capsule Take 1 capsule by mouth twice daily   meloxicam (MOBIC) 15 MG tablet Take 1 tablet by mouth once daily   mirtazapine (REMERON) 30 MG tablet Take 1 tablet by mouth once daily   montelukast (SINGULAIR) 10 MG tablet Take 1 tablet (10 mg total) by mouth daily.   multivitamin-iron-minerals-folic acid (CENTRUM) chewable tablet Chew 1 tablet by mouth daily.   Omega-3 Fatty Acids (FISH OIL) 1000 MG CAPS  Fish Oil 1000 MG Oral Capsule QTY: 0 capsule Days: 0 Refills: 0  Written: 06/23/14 Patient Instructions:   pantoprazole (PROTONIX) 40 MG tablet Take 1 tablet (40 mg total) by mouth daily.   TRELEGY ELLIPTA 100-62.5-25 MCG/ACT AEPB Inhale 1 puff into the lungs daily.   VENTOLIN HFA 108 (90 Base) MCG/ACT inhaler    VITAMIN E PO Take by mouth daily.   omega-3 acid ethyl esters (LOVAZA) 1 g capsule Take by mouth.   [DISCONTINUED] predniSONE (DELTASONE) 20 MG tablet Take 2 tablets (40 mg total) by mouth daily with breakfast. (Patient not taking: Reported on 11/06/2022)   No facility-administered medications prior to visit.    Review of Systems  Constitutional:  Positive for malaise/fatigue. Negative for chills, diaphoresis, fever and weight loss.  HENT:  Positive for congestion and sore throat. Negative for ear pain.   Eyes: Negative.   Respiratory:  Positive for cough, sputum production, shortness of breath and wheezing. Negative for stridor.   Cardiovascular: Negative.  Negative for chest pain, palpitations and leg swelling.  Gastrointestinal: Negative.  Negative for abdominal pain, constipation, diarrhea, heartburn, nausea and vomiting.  Genitourinary: Negative.  Negative for dysuria and flank pain.  Musculoskeletal: Negative.  Negative for joint pain and myalgias.  Skin: Negative.   Neurological: Negative.  Negative for dizziness and headaches.  Endo/Heme/Allergies: Negative.   Psychiatric/Behavioral: Negative.  Negative for depression and suicidal ideas. The patient is not nervous/anxious.        Objective:   BP (!) 146/78   Pulse 72   Temp (!) 96.6 F (35.9 C) (Tympanic)   Ht 5\' 2"  (1.575 m)   Wt 116 lb 6.4 oz (52.8 kg)   SpO2 95%   BMI 21.29 kg/m   Vitals:   12/07/22 0900  BP: (!) 146/78  Pulse: 72  Temp: (!) 96.6 F (35.9 C)  Height: 5\' 2"  (1.575 m)  Weight: 116 lb 6.4 oz (52.8 kg)  SpO2: 95%  TempSrc: Tympanic  BMI (Calculated): 21.28    Physical Exam Vitals  and nursing note reviewed.  Constitutional:      Appearance: Normal appearance.  HENT:     Head: Normocephalic and atraumatic.     Nose: Nose normal.     Mouth/Throat:     Mouth: Mucous membranes are moist.     Pharynx: Oropharynx is clear.  Eyes:     Conjunctiva/sclera: Conjunctivae normal.     Pupils: Pupils are equal, round, and reactive to light.  Cardiovascular:     Rate and Rhythm: Normal rate and regular rhythm.  Pulses: Normal pulses.     Heart sounds: Normal heart sounds. No murmur heard. Pulmonary:     Effort: Pulmonary effort is normal. No respiratory distress.     Breath sounds: Wheezing present. No rhonchi or rales.  Chest:     Chest wall: No tenderness.  Abdominal:     General: Bowel sounds are normal.     Palpations: Abdomen is soft.     Tenderness: There is no abdominal tenderness. There is no right CVA tenderness, left CVA tenderness or guarding.  Musculoskeletal:        General: Normal range of motion.     Cervical back: Normal range of motion.     Right lower leg: No edema.     Left lower leg: No edema.  Skin:    General: Skin is warm and dry.  Neurological:     General: No focal deficit present.     Mental Status: She is alert and oriented to person, place, and time.  Psychiatric:        Mood and Affect: Mood normal.        Behavior: Behavior normal.      Results for orders placed or performed in visit on 12/07/22  POCT XPERT XPRESS SARS COVID-2/FLU/RSV [[id number]]  Result Value Ref Range   SARS Coronavirus 2 negative    FLU A negative    FLU B negative    RSV RNA, PCR negative     Recent Results (from the past 2160 hour(s))  CBC with Diff     Status: Abnormal   Collection Time: 10/10/22 10:29 AM  Result Value Ref Range   WBC 9.5 3.4 - 10.8 x10E3/uL   RBC 3.90 3.77 - 5.28 x10E6/uL   Hemoglobin 12.7 11.1 - 15.9 g/dL   Hematocrit 78.2 95.6 - 46.6 %   MCV 101 (H) 79 - 97 fL   MCH 32.6 26.6 - 33.0 pg   MCHC 32.2 31.5 - 35.7 g/dL   RDW  21.3 08.6 - 57.8 %   Platelets 263 150 - 450 x10E3/uL   Neutrophils 58 Not Estab. %   Lymphs 30 Not Estab. %   Monocytes 7 Not Estab. %   Eos 4 Not Estab. %   Basos 1 Not Estab. %   Neutrophils Absolute 5.6 1.4 - 7.0 x10E3/uL   Lymphocytes Absolute 2.8 0.7 - 3.1 x10E3/uL   Monocytes Absolute 0.7 0.1 - 0.9 x10E3/uL   EOS (ABSOLUTE) 0.4 0.0 - 0.4 x10E3/uL   Basophils Absolute 0.1 0.0 - 0.2 x10E3/uL   Immature Granulocytes 0 Not Estab. %   Immature Grans (Abs) 0.0 0.0 - 0.1 x10E3/uL  CMP14+EGFR     Status: Abnormal   Collection Time: 10/10/22 10:29 AM  Result Value Ref Range   Glucose 92 70 - 99 mg/dL   BUN 18 8 - 27 mg/dL   Creatinine, Ser 4.69 (H) 0.57 - 1.00 mg/dL   eGFR 50 (L) >62 XB/MWU/1.32   BUN/Creatinine Ratio 15 12 - 28   Sodium 137 134 - 144 mmol/L   Potassium 5.3 (H) 3.5 - 5.2 mmol/L   Chloride 100 96 - 106 mmol/L   CO2 20 20 - 29 mmol/L   Calcium 10.4 (H) 8.7 - 10.3 mg/dL   Total Protein 7.4 6.0 - 8.5 g/dL   Albumin 4.5 3.9 - 4.9 g/dL   Globulin, Total 2.9 1.5 - 4.5 g/dL   Bilirubin Total 0.4 0.0 - 1.2 mg/dL   Alkaline Phosphatase 99 44 - 121 IU/L  AST 32 0 - 40 IU/L   ALT 17 0 - 32 IU/L  Lipid Panel w/o Chol/HDL Ratio     Status: None   Collection Time: 10/10/22 10:29 AM  Result Value Ref Range   Cholesterol, Total 131 100 - 199 mg/dL   Triglycerides 99 0 - 149 mg/dL   HDL 60 >95 mg/dL   VLDL Cholesterol Cal 18 5 - 40 mg/dL   LDL Chol Calc (NIH) 53 0 - 99 mg/dL  POCT Urinalysis Dipstick (28413)     Status: Abnormal   Collection Time: 10/25/22 10:10 AM  Result Value Ref Range   Color, UA Yellow    Clarity, UA Cloudy    Glucose, UA Negative Negative   Bilirubin, UA Negative    Ketones, UA Negative    Spec Grav, UA <=1.005 (A) 1.010 - 1.025   Blood, UA 3+    pH, UA 7.0 5.0 - 8.0   Protein, UA Negative Negative   Urobilinogen, UA negative (A) 0.2 or 1.0 E.U./dL   Nitrite, UA Negative    Leukocytes, UA Large (3+) (A) Negative   Appearance Cloudy     Odor YES   Urine Culture     Status: Abnormal   Collection Time: 10/25/22 10:37 AM   Specimen: Urine, Clean Catch   UC  Result Value Ref Range   Urine Culture, Routine Final report (A)    Organism ID, Bacteria Escherichia coli (A)     Comment: Multi-Drug Resistant Organism Susceptibility profile is consistent with a probable ESBL. 25,000-50,000 colony forming units per mL    Antimicrobial Susceptibility Comment     Comment:       ** S = Susceptible; I = Intermediate; R = Resistant **                    P = Positive; N = Negative             MICS are expressed in micrograms per mL    Antibiotic                 RSLT#1    RSLT#2    RSLT#3    RSLT#4 Amoxicillin/Clavulanic Acid    R Ampicillin                     R Cefazolin                      R Cefepime                       S Ceftriaxone                    R Cefuroxime                     I Ciprofloxacin                  S Ertapenem                      S Gentamicin                     S Imipenem                       S Levofloxacin                   S  Meropenem                      S Nitrofurantoin                 S Piperacillin/Tazobactam        S Tetracycline                   S Tobramycin                     S Trimethoprim/Sulfa             R   Urinalysis, Routine w reflex microscopic     Status: Abnormal   Collection Time: 10/25/22 10:39 AM  Result Value Ref Range   Specific Gravity, UA 1.007 1.005 - 1.030   pH, UA 7.0 5.0 - 7.5   Color, UA Yellow Yellow   Appearance Ur Cloudy (A) Clear   Leukocytes,UA 3+ (A) Negative   Protein,UA Negative Negative/Trace   Glucose, UA Negative Negative   Ketones, UA Negative Negative   RBC, UA 2+ (A) Negative   Bilirubin, UA Negative Negative   Urobilinogen, Ur 0.2 0.2 - 1.0 mg/dL   Nitrite, UA Negative Negative   Microscopic Examination See below:     Comment: Microscopic was indicated and was performed.  Microscopic Examination     Status: Abnormal   Collection Time:  10/25/22 10:39 AM  Result Value Ref Range   WBC, UA >30 (A) 0 - 5 /hpf   RBC, Urine 11-30 (A) 0 - 2 /hpf   Epithelial Cells (non renal) 0-10 0 - 10 /hpf   Casts None seen None seen /lpf   Bacteria, UA None seen None seen/Few  Basic metabolic panel     Status: Abnormal   Collection Time: 11/06/22 10:50 AM  Result Value Ref Range   Glucose 91 70 - 99 mg/dL   BUN 18 8 - 27 mg/dL   Creatinine, Ser 1.61 (H) 0.57 - 1.00 mg/dL   eGFR 53 (L) >09 UE/AVW/0.98   BUN/Creatinine Ratio 16 12 - 28   Sodium 130 (L) 134 - 144 mmol/L   Potassium 5.0 3.5 - 5.2 mmol/L   Chloride 91 (L) 96 - 106 mmol/L   CO2 20 20 - 29 mmol/L   Calcium 9.9 8.7 - 10.3 mg/dL  [facility] Molecular Screen- Blood ([facility])     Status: None   Collection Time: 11/13/22  8:33 AM  Result Value Ref Range   Genetic Analysis Overall Interpretation Negative    Genetic Disease Assessed      [facility] Tier One Population Screen is a screening test that analyzes 11 genes related to hereditary breast and ovarian cancer (HBOC) syndrome, Lynch syndrome, and familial hypercholesterolemia. This test only reports clinically significant pathogenic and  likely pathogenic variants but does not report variants of uncertain significance (VUS). In addition, analysis of the PMS2 gene excludes exons 11-15, which overlap with a known pseudogene (PMS2CL).    Genetic Analysis Report      No pathogenic or likely pathogenic variants were detected in the genes analyzed by this test.Genetic test results should be interpreted in the context of an individual's personal medical and family history. Alteration to medical management is NOT  recommended based solely on this result. Clinical correlation is advised.Additional Considerations- This is a screening test; individuals may still carry pathogenic or likely pathogenic variant(s) in the tested genes that are not detected by this test.-  For individuals at risk for these or other related conditions based  on factors including personal or family history, diagnostic testing is recommended.- The absence of pathogenic or likely pathogenic variant(s) in the analyzed genes, while reassuring,  does not eliminate the possibility of a hereditary condition; there are other variants and genes associated with heart disease and hereditary cancer that are not included in this test.    Genes Tested See Notes     Comment: APOB, BRCA1, BRCA2, EPCAM, LDLR, LDLRAP1, PCSK9, PMS2, MLH1, MSH2, MSH6   Disclaimer See Notes     Comment: This test was developed and validated by [facility] This test has not been cleared or approved by the [place] (FDA). The [facility] laboratory is accredited by the College of American Pathologists (CAP) and certified under  the Clinical Laboratory Improvement Amendments (CLIA #: [id number]) to perform high-complexity clinical tests. This test is used for clinical purposes. It should not be regarded as investigational or for research.    Sequencing Location See Notes     Comment: Sequencing done at [facility] [address], [address], [place], [place] [address] (CLIA# [id number])   Interpretation Methods and Limitations See Notes     Comment: Extracted DNA is enriched for targeted regions and then sequenced using the [facility] Exome+ (R) assay on an Illumina DNA sequencing system. Data is then aligned to a modified version of GRCh38 and all genes are analyzed using the MANE transcript and MANE  Plus Clinical transcript, when available. Small variant calling is completed using a customized version of [employer]'s DNAseq software, augmented by a proprietary small variant caller for difficult variants. Copy number variants (CNVs) are then called  using a proprietary bioinformatics pipeline based on depth analysis with a comparison to similarly sequenced samples. Analysis of the PMS2 gene is limited to exons 1-10. The interpretation and reporting of variants in APOB,  PCSK9, and LDLR is specific to  familial hypercholesterolemia; variants associated with hypobetalipoproteinemia are not included. Interpretation is based upon guidelines published by the [employer]) and the Association for  Molecular  Pathology (AMP) or their modification by [employer] when available. Interpretation is limited to the transcripts indicated on the report and +/- 10 bp into intronic regions, except as noted below. [facility] variant classifications  include pathogenic, likely pathogenic, variant of uncertain significance (VUS), likely benign, and benign. Only variants classified as pathogenic and likely pathogenic are included in the report. All reported variants are confirmed through secondary  manual inspection of DNA sequence data or orthogonal testing. Risk estimations and management guidelines included in this report are based on analysis of primary literature and recommendations of applicable professional societies, and should be regarded  as approximations.Based on validation studies, this assay delivers > 99% sensitivity and specificity for single nucleotide variants and insertions and deletions (indels) up to 20 bp. Larger indels and complex variants are a lso reported but sensitivity  may be reduced. Based on validation studies, this assay delivers > 99% sensitivity to multi-exon CNVs and > 90% sensitivity to single-exon CNVs. This test may not detect variants in challenging regions (such as short tandem repeats, homopolymer runs, and  segment duplications), sub-exonic CNVs, chromosomal aneuploidy, or variants in the presence of mosaicism. Phasing will be attempted and reported, when possible. Structural rearrangements such as inversions, translocations, and gene conversions are not  tested in this assay unless explicitly indicated. Additionally, deep intronic, promoter, and  enhancer regions may not be covered. It is  important to note that this is a screening test and cannot detect all disease-causing variants. A negative result does  not guarantee the absence of a rare, undetectable variant in the genes analyzed; consider using a diagnostic test if there is significant personal and/or family history of one of the conditions analyze d by this test. Any potential incidental findings  outside of these genes and conditions will not be identified, nor reported. The results of a genetic test may be influenced by various factors, including bone marrow transplantation, blood transfusions, or in rare cases, hematolymphoid neoplasms.Gene  Specific Notes:APOB: analysis is limited to c.10580G>A and c.10579C>T; BRCA1: sequencing analysis extends to CDS +/-20 bp; BRCA2: sequencing analysis extends to CDS +/-20 bp. EPCAM: analysis is limited to CNV of exons 8-9; MLH1: analysis includes CNV of  the promoter; PMS2: analysis is limited to exons 1-10.Gardenia Phlegm, PhD, [email or web address]   POCT XPERT XPRESS SARS COVID-2/FLU/RSV [[id number]]     Status: Normal   Collection Time: 12/07/22 11:52 AM  Result Value Ref Range   SARS Coronavirus 2 negative    FLU A negative    FLU B negative    RSV RNA, PCR negative       Assessment & Plan:  Start Prednisone, antibiotics. Rest, fluids. Consider CXR - if not better. Problem List Items Addressed This Visit     COPD, moderate (HCC)   Relevant Medications   predniSONE (DELTASONE) 20 MG tablet   Other Relevant Orders   POCT XPERT XPRESS SARS COVID-2/FLU/RSV [[id number]] (Completed)   Other Visit Diagnoses     Bronchitis    -  Primary   Relevant Medications   amoxicillin-clavulanate (AUGMENTIN) 875-125 MG tablet   Other Relevant Orders   POCT XPERT XPRESS SARS COVID-2/FLU/RSV [[id number]] (Completed)       Return in about 1 week (around 12/14/2022).   Total time spent: 30 minutes  Margaretann Loveless, MD  12/07/2022   This document may have been prepared by  [facility] Voice Recognition software and as such may include unintentional dictation errors.

## 2022-12-19 ENCOUNTER — Encounter: Payer: Self-pay | Admitting: Internal Medicine

## 2022-12-19 ENCOUNTER — Ambulatory Visit (INDEPENDENT_AMBULATORY_CARE_PROVIDER_SITE_OTHER): Payer: Medicare HMO | Admitting: Internal Medicine

## 2022-12-19 VITALS — BP 138/72 | HR 70 | Ht 62.0 in | Wt 115.0 lb

## 2022-12-19 DIAGNOSIS — Z1211 Encounter for screening for malignant neoplasm of colon: Secondary | ICD-10-CM

## 2022-12-19 DIAGNOSIS — F17200 Nicotine dependence, unspecified, uncomplicated: Secondary | ICD-10-CM

## 2022-12-19 DIAGNOSIS — Z13820 Encounter for screening for osteoporosis: Secondary | ICD-10-CM | POA: Diagnosis not present

## 2022-12-19 DIAGNOSIS — E782 Mixed hyperlipidemia: Secondary | ICD-10-CM | POA: Diagnosis not present

## 2022-12-19 DIAGNOSIS — K219 Gastro-esophageal reflux disease without esophagitis: Secondary | ICD-10-CM

## 2022-12-19 DIAGNOSIS — Z0001 Encounter for general adult medical examination with abnormal findings: Secondary | ICD-10-CM

## 2022-12-19 DIAGNOSIS — J449 Chronic obstructive pulmonary disease, unspecified: Secondary | ICD-10-CM

## 2022-12-19 DIAGNOSIS — Z720 Tobacco use: Secondary | ICD-10-CM | POA: Diagnosis not present

## 2022-12-19 DIAGNOSIS — Z0000 Encounter for general adult medical examination without abnormal findings: Secondary | ICD-10-CM

## 2022-12-19 DIAGNOSIS — F411 Generalized anxiety disorder: Secondary | ICD-10-CM

## 2022-12-19 DIAGNOSIS — I10 Essential (primary) hypertension: Secondary | ICD-10-CM

## 2022-12-19 NOTE — Progress Notes (Signed)
Established Patient Office Visit  Subjective:  Patient ID: Leah Riley, female    DOB: [birth year]  Age: 67 y.o. MRN: [id number]  No chief complaint on file.   Patient is here for her annual wellness visit.  She is actually feeling better today and does not have any complaints of cough, chest congestion or shortness of breath.  Her labs were done at her last visit and the results have been discussed already.  She is up-to-date on her mammogram.  However she had missed her colonoscopy and bone density.  Will schedule her DEXA scan. She would prefer to get Cologuard done this time. Patient also continues to smoke cigarettes, and is due for a CT chest. Her PHQ-9/GAD score is 8/8. CFS score is 2.    No other concerns at this time.   Past Medical History:  Diagnosis Date   COPD (chronic obstructive pulmonary disease) (HCC)    GERD (gastroesophageal reflux disease)    Headache    migraines   Hypertension     Past Surgical History:  Procedure Laterality Date   ABDOMINAL HYSTERECTOMY     APPENDECTOMY     BREAST CYST EXCISION Right    age 76   BREAST SURGERY Right    cyst removed   CYST EXCISION Left 07/28/2015   Procedure: CYST REMOVAL OF SUBCUTANEOUS LEFT KNEE;  Surgeon: Christena Flake, MD;  Location: [facility];  Service: Orthopedics;  Laterality: Left;   FOOT SURGERY Right    x3   KNEE ARTHROSCOPY Left    x2   OOPHORECTOMY     SHOULDER ARTHROSCOPY Right 05/26/2015   Procedure: SHOULDER RIGHT LIMITED ARTHROSCOPIC DEBRIDEMENT ARTHROSCOPIC SUBACROMIAL DECOMPRESSION MINI OPEN ROTATOR CUFF REPAIR MINI OPEN BICEPS TENODESIS;  Surgeon: Christena Flake, MD;  Location: [facility];  Service: Orthopedics;  Laterality: Right;   TUBAL LIGATION      Social History   Socioeconomic History   Marital status: Divorced    Spouse name: Not on file   Number of children: Not on file   Years of education: Not on file   Highest education level: Not on file  Occupational History    Not on file  Tobacco Use   Smoking status: Every Day    Current packs/day: 1.50    Average packs/day: 1.5 packs/day for 35.0 years (52.5 ttl pk-yrs)    Types: Cigarettes   Smokeless tobacco: Never   Tobacco comments:    0.5 pack a day   Substance and Sexual Activity   Alcohol use: Yes    Comment: occas - couple times per year   Drug use: Not on file   Sexual activity: Not on file  Other Topics Concern   Not on file  Social History Narrative   Not on file   Social Drivers of Health   Financial Resource Strain: Medium Risk (12/19/2022)   Overall Financial Resource Strain (CARDIA)    Difficulty of Paying Living Expenses: Somewhat hard  Food Insecurity: No Food Insecurity (12/19/2022)   Hunger Vital Sign    Worried About Running Out of Food in the Last Year: Never true    Ran Out of Food in the Last Year: Never true  Transportation Needs: No Transportation Needs (12/19/2022)   PRAPARE - Administrator, Civil Service (Medical): No    Lack of Transportation (Non-Medical): No  Physical Activity: Not on file  Stress: Stress Concern Present (12/19/2022)   [employer] of Occupational Health - Occupational Stress  Questionnaire    Feeling of Stress : To some extent  Social Connections: Not on file  Intimate Partner Violence: Not At Risk (12/19/2022)   Humiliation, Afraid, Rape, and Kick questionnaire    Fear of Current or Ex-Partner: No    Emotionally Abused: No    Physically Abused: No    Sexually Abused: No    Family History  Problem Relation Age of Onset   Coronary artery disease Mother    Coronary artery disease Father     Allergies  Allergen Reactions   Gluten Meal Nausea And Vomiting    Itching, blistering   Sulfa Antibiotics Swelling    Rash, itching    Outpatient Medications Prior to Visit  Medication Sig   alendronate (FOSAMAX) 70 MG tablet Take 70 mg by mouth once a week.   amLODipine (NORVASC) 5 MG tablet Take 1 tablet (5 mg total) by  mouth daily.   aspirin 81 MG tablet Take 81 mg by mouth daily.   atorvastatin (LIPITOR) 20 MG tablet Take 1 tablet (20 mg total) by mouth daily.   baclofen (LIORESAL) 10 MG tablet    buPROPion (WELLBUTRIN XL) 150 MG 24 hr tablet TAKE 1 TABLET BY MOUTH IN THE MORNING   Calcium Carbonate-Vitamin D (CALTRATE 600+D PO) Take by mouth 2 (two) times daily.   estradiol (CLIMARA - DOSED IN MG/24 HR) 0.1 mg/24hr patch APPLY 1 PATCH TOPICALLY ONCE A WEEK   fluticasone (FLONASE) 50 MCG/ACT nasal spray Place into both nostrils daily.   gabapentin (NEURONTIN) 300 MG capsule Take 1 capsule by mouth twice daily   meloxicam (MOBIC) 15 MG tablet Take 1 tablet by mouth once daily   mirtazapine (REMERON) 30 MG tablet Take 1 tablet by mouth once daily   montelukast (SINGULAIR) 10 MG tablet Take 1 tablet (10 mg total) by mouth daily.   multivitamin-iron-minerals-folic acid (CENTRUM) chewable tablet Chew 1 tablet by mouth daily.   omega-3 acid ethyl esters (LOVAZA) 1 g capsule Take by mouth.   Omega-3 Fatty Acids (FISH OIL) 1000 MG CAPS Fish Oil 1000 MG Oral Capsule QTY: 0 capsule Days: 0 Refills: 0  Written: 06/23/14 Patient Instructions:   pantoprazole (PROTONIX) 40 MG tablet Take 1 tablet (40 mg total) by mouth daily.   TRELEGY ELLIPTA 100-62.5-25 MCG/ACT AEPB Inhale 1 puff into the lungs daily.   VENTOLIN HFA 108 (90 Base) MCG/ACT inhaler    VITAMIN E PO Take by mouth daily.   [DISCONTINUED] amoxicillin-clavulanate (AUGMENTIN) 875-125 MG tablet Take 1 tablet by mouth 2 (two) times daily.   [DISCONTINUED] predniSONE (DELTASONE) 20 MG tablet Take 2 tablets (40 mg total) by mouth daily with breakfast.   No facility-administered medications prior to visit.    Review of Systems  Constitutional: Negative.  Negative for chills, fever, malaise/fatigue and weight loss.  HENT: Negative.  Negative for congestion, hearing loss and sinus pain.   Eyes: Negative.   Respiratory: Negative.  Negative for cough, shortness of  breath and wheezing.   Cardiovascular: Negative.  Negative for chest pain, palpitations and leg swelling.  Gastrointestinal: Negative.  Negative for abdominal pain, constipation, diarrhea, heartburn, nausea and vomiting.  Genitourinary: Negative.  Negative for dysuria and flank pain.  Musculoskeletal: Negative.  Negative for joint pain and myalgias.  Skin: Negative.   Neurological: Negative.  Negative for dizziness and headaches.  Endo/Heme/Allergies: Negative.   Psychiatric/Behavioral: Negative.  Negative for depression and suicidal ideas. The patient is not nervous/anxious.        Objective:  BP 138/72   Pulse 70   Ht 5\' 2"  (1.575 m)   Wt 115 lb (52.2 kg)   SpO2 97%   BMI 21.03 kg/m   Vitals:   12/19/22 0856  BP: 138/72  Pulse: 70  Height: 5\' 2"  (1.575 m)  Weight: 115 lb (52.2 kg)  SpO2: 97%  BMI (Calculated): 21.03    Physical Exam Vitals and nursing note reviewed.  Constitutional:      Appearance: Normal appearance.  HENT:     Head: Normocephalic and atraumatic.     Nose: Nose normal.     Mouth/Throat:     Mouth: Mucous membranes are moist.     Pharynx: Oropharynx is clear.  Eyes:     Conjunctiva/sclera: Conjunctivae normal.     Pupils: Pupils are equal, round, and reactive to light.  Cardiovascular:     Rate and Rhythm: Normal rate and regular rhythm.     Pulses: Normal pulses.     Heart sounds: Normal heart sounds. No murmur heard. Pulmonary:     Effort: Pulmonary effort is normal.     Breath sounds: Normal breath sounds. No wheezing.  Abdominal:     General: Bowel sounds are normal.     Palpations: Abdomen is soft.     Tenderness: There is no abdominal tenderness. There is no right CVA tenderness or left CVA tenderness.  Musculoskeletal:        General: Normal range of motion.     Cervical back: Normal range of motion.     Right lower leg: No edema.     Left lower leg: No edema.  Skin:    General: Skin is warm and dry.  Neurological:      General: No focal deficit present.     Mental Status: She is alert and oriented to person, place, and time.  Psychiatric:        Mood and Affect: Mood normal.        Behavior: Behavior normal.      No results found for any visits on 12/19/22.  Recent Results (from the past 2160 hours)  CBC with Diff     Status: Abnormal   Collection Time: 10/10/22 10:29 AM  Result Value Ref Range   WBC 9.5 3.4 - 10.8 x10E3/uL   RBC 3.90 3.77 - 5.28 x10E6/uL   Hemoglobin 12.7 11.1 - 15.9 g/dL   Hematocrit 66.4 40.3 - 46.6 %   MCV 101 (H) 79 - 97 fL   MCH 32.6 26.6 - 33.0 pg   MCHC 32.2 31.5 - 35.7 g/dL   RDW 47.4 25.9 - 56.3 %   Platelets 263 150 - 450 x10E3/uL   Neutrophils 58 Not Estab. %   Lymphs 30 Not Estab. %   Monocytes 7 Not Estab. %   Eos 4 Not Estab. %   Basos 1 Not Estab. %   Neutrophils Absolute 5.6 1.4 - 7.0 x10E3/uL   Lymphocytes Absolute 2.8 0.7 - 3.1 x10E3/uL   Monocytes Absolute 0.7 0.1 - 0.9 x10E3/uL   EOS (ABSOLUTE) 0.4 0.0 - 0.4 x10E3/uL   Basophils Absolute 0.1 0.0 - 0.2 x10E3/uL   Immature Granulocytes 0 Not Estab. %   Immature Grans (Abs) 0.0 0.0 - 0.1 x10E3/uL  CMP14+EGFR     Status: Abnormal   Collection Time: 10/10/22 10:29 AM  Result Value Ref Range   Glucose 92 70 - 99 mg/dL   BUN 18 8 - 27 mg/dL   Creatinine, Ser 8.75 (H) 0.57 - 1.00 mg/dL  eGFR 50 (L) >59 mL/min/1.73   BUN/Creatinine Ratio 15 12 - 28   Sodium 137 134 - 144 mmol/L   Potassium 5.3 (H) 3.5 - 5.2 mmol/L   Chloride 100 96 - 106 mmol/L   CO2 20 20 - 29 mmol/L   Calcium 10.4 (H) 8.7 - 10.3 mg/dL   Total Protein 7.4 6.0 - 8.5 g/dL   Albumin 4.5 3.9 - 4.9 g/dL   Globulin, Total 2.9 1.5 - 4.5 g/dL   Bilirubin Total 0.4 0.0 - 1.2 mg/dL   Alkaline Phosphatase 99 44 - 121 IU/L   AST 32 0 - 40 IU/L   ALT 17 0 - 32 IU/L  Lipid Panel w/o Chol/HDL Ratio     Status: None   Collection Time: 10/10/22 10:29 AM  Result Value Ref Range   Cholesterol, Total 131 100 - 199 mg/dL   Triglycerides 99 0 -  149 mg/dL   HDL 60 >41 mg/dL   VLDL Cholesterol Cal 18 5 - 40 mg/dL   LDL Chol Calc (NIH) 53 0 - 99 mg/dL  POCT Urinalysis Dipstick (32440)     Status: Abnormal   Collection Time: 10/25/22 10:10 AM  Result Value Ref Range   Color, UA Yellow    Clarity, UA Cloudy    Glucose, UA Negative Negative   Bilirubin, UA Negative    Ketones, UA Negative    Spec Grav, UA <=1.005 (A) 1.010 - 1.025   Blood, UA 3+    pH, UA 7.0 5.0 - 8.0   Protein, UA Negative Negative   Urobilinogen, UA negative (A) 0.2 or 1.0 E.U./dL   Nitrite, UA Negative    Leukocytes, UA Large (3+) (A) Negative   Appearance Cloudy    Odor YES   Urine Culture     Status: Abnormal   Collection Time: 10/25/22 10:37 AM   Specimen: Urine, Clean Catch   UC  Result Value Ref Range   Urine Culture, Routine Final report (A)    Organism ID, Bacteria Escherichia coli (A)     Comment: Multi-Drug Resistant Organism Susceptibility profile is consistent with a probable ESBL. 25,000-50,000 colony forming units per mL    Antimicrobial Susceptibility Comment     Comment:       ** S = Susceptible; I = Intermediate; R = Resistant **                    P = Positive; N = Negative             MICS are expressed in micrograms per mL    Antibiotic                 RSLT#1    RSLT#2    RSLT#3    RSLT#4 Amoxicillin/Clavulanic Acid    R Ampicillin                     R Cefazolin                      R Cefepime                       S Ceftriaxone                    R Cefuroxime                     I Ciprofloxacin  S Ertapenem                      S Gentamicin                     S Imipenem                       S Levofloxacin                   S Meropenem                      S Nitrofurantoin                 S Piperacillin/Tazobactam        S Tetracycline                   S Tobramycin                     S Trimethoprim/Sulfa             R   Urinalysis, Routine w reflex microscopic     Status: Abnormal   Collection  Time: 10/25/22 10:39 AM  Result Value Ref Range   Specific Gravity, UA 1.007 1.005 - 1.030   pH, UA 7.0 5.0 - 7.5   Color, UA Yellow Yellow   Appearance Ur Cloudy (A) Clear   Leukocytes,UA 3+ (A) Negative   Protein,UA Negative Negative/Trace   Glucose, UA Negative Negative   Ketones, UA Negative Negative   RBC, UA 2+ (A) Negative   Bilirubin, UA Negative Negative   Urobilinogen, Ur 0.2 0.2 - 1.0 mg/dL   Nitrite, UA Negative Negative   Microscopic Examination See below:     Comment: Microscopic was indicated and was performed.  Microscopic Examination     Status: Abnormal   Collection Time: 10/25/22 10:39 AM  Result Value Ref Range   WBC, UA >30 (A) 0 - 5 /hpf   RBC, Urine 11-30 (A) 0 - 2 /hpf   Epithelial Cells (non renal) 0-10 0 - 10 /hpf   Casts None seen None seen /lpf   Bacteria, UA None seen None seen/Few  Basic metabolic panel     Status: Abnormal   Collection Time: 11/06/22 10:50 AM  Result Value Ref Range   Glucose 91 70 - 99 mg/dL   BUN 18 8 - 27 mg/dL   Creatinine, Ser 1.61 (H) 0.57 - 1.00 mg/dL   eGFR 53 (L) >09 UE/AVW/0.98   BUN/Creatinine Ratio 16 12 - 28   Sodium 130 (L) 134 - 144 mmol/L   Potassium 5.0 3.5 - 5.2 mmol/L   Chloride 91 (L) 96 - 106 mmol/L   CO2 20 20 - 29 mmol/L   Calcium 9.9 8.7 - 10.3 mg/dL  [facility] Molecular Screen- Blood ([facility])     Status: None   Collection Time: 11/13/22  8:33 AM  Result Value Ref Range   Genetic Analysis Overall Interpretation Negative    Genetic Disease Assessed      [facility] Tier One Population Screen is a screening test that analyzes 11 genes related to hereditary breast and ovarian cancer (HBOC) syndrome, Lynch syndrome, and familial hypercholesterolemia. This test only reports clinically significant pathogenic and  likely pathogenic variants but does not report variants of uncertain significance (VUS). In addition, analysis of the PMS2 gene excludes exons 11-15, which overlap with  a known pseudogene  (PMS2CL).    Genetic Analysis Report      No pathogenic or likely pathogenic variants were detected in the genes analyzed by this test.Genetic test results should be interpreted in the context of an individual's personal medical and family history. Alteration to medical management is NOT  recommended based solely on this result. Clinical correlation is advised.Additional Considerations- This is a screening test; individuals may still carry pathogenic or likely pathogenic variant(s) in the tested genes that are not detected by this test.-  For individuals at risk for these or other related conditions based on factors including personal or family history, diagnostic testing is recommended.- The absence of pathogenic or likely pathogenic variant(s) in the analyzed genes, while reassuring,  does not eliminate the possibility of a hereditary condition; there are other variants and genes associated with heart disease and hereditary cancer that are not included in this test.    Genes Tested See Notes     Comment: APOB, BRCA1, BRCA2, EPCAM, LDLR, LDLRAP1, PCSK9, PMS2, MLH1, MSH2, MSH6   Disclaimer See Notes     Comment: This test was developed and validated by [facility] This test has not been cleared or approved by the [place] (FDA). The [facility] laboratory is accredited by the College of American Pathologists (CAP) and certified under  the Clinical Laboratory Improvement Amendments (CLIA #: [id number]) to perform high-complexity clinical tests. This test is used for clinical purposes. It should not be regarded as investigational or for research.    Sequencing Location See Notes     Comment: Sequencing done at [facility] [address], [address], [place], [place] [address] (CLIA# [id number])   Interpretation Methods and Limitations See Notes     Comment: Extracted DNA is enriched for targeted regions and then sequenced using the [facility] Exome+ (R) assay on an Illumina  DNA sequencing system. Data is then aligned to a modified version of GRCh38 and all genes are analyzed using the MANE transcript and MANE  Plus Clinical transcript, when available. Small variant calling is completed using a customized version of [employer]'s DNAseq software, augmented by a proprietary small variant caller for difficult variants. Copy number variants (CNVs) are then called  using a proprietary bioinformatics pipeline based on depth analysis with a comparison to similarly sequenced samples. Analysis of the PMS2 gene is limited to exons 1-10. The interpretation and reporting of variants in APOB, PCSK9, and LDLR is specific to  familial hypercholesterolemia; variants associated with hypobetalipoproteinemia are not included. Interpretation is based upon guidelines published by the [employer] and [employer]) and the Association for  Molecular  Pathology (AMP) or their modification by [employer] when available. Interpretation is limited to the transcripts indicated on the report and +/- 10 bp into intronic regions, except as noted below. [facility] variant classifications  include pathogenic, likely pathogenic, variant of uncertain significance (VUS), likely benign, and benign. Only variants classified as pathogenic and likely pathogenic are included in the report. All reported variants are confirmed through secondary  manual inspection of DNA sequence data or orthogonal testing. Risk estimations and management guidelines included in this report are based on analysis of primary literature and recommendations of applicable professional societies, and should be regarded  as approximations.Based on validation studies, this assay delivers > 99% sensitivity and specificity for single nucleotide variants and insertions and deletions (indels) up to 20 bp. Larger indels and complex variants are a lso  reported but sensitivity  may be reduced. Based on  validation studies, this assay delivers > 99% sensitivity to multi-exon CNVs and > 90% sensitivity to single-exon CNVs. This test may not detect variants in challenging regions (such as short tandem repeats, homopolymer runs, and  segment duplications), sub-exonic CNVs, chromosomal aneuploidy, or variants in the presence of mosaicism. Phasing will be attempted and reported, when possible. Structural rearrangements such as inversions, translocations, and gene conversions are not  tested in this assay unless explicitly indicated. Additionally, deep intronic, promoter, and enhancer regions may not be covered. It is important to note that this is a screening test and cannot detect all disease-causing variants. A negative result does  not guarantee the absence of a rare, undetectable variant in the genes analyzed; consider using a diagnostic test if there is significant personal and/or family history of one of the conditions analyze d by this test. Any potential incidental findings  outside of these genes and conditions will not be identified, nor reported. The results of a genetic test may be influenced by various factors, including bone marrow transplantation, blood transfusions, or in rare cases, hematolymphoid neoplasms.Gene  Specific Notes:APOB: analysis is limited to c.10580G>A and c.10579C>T; BRCA1: sequencing analysis extends to CDS +/-20 bp; BRCA2: sequencing analysis extends to CDS +/-20 bp. EPCAM: analysis is limited to CNV of exons 8-9; MLH1: analysis includes CNV of  the promoter; PMS2: analysis is limited to exons 1-10.Gardenia Phlegm, PhD, [email or web address]   POCT XPERT XPRESS SARS COVID-2/FLU/RSV [[id number]]     Status: Normal   Collection Time: 12/07/22 11:52 AM  Result Value Ref Range   SARS Coronavirus 2 negative    FLU A negative    FLU B negative    RSV RNA, PCR negative       Assessment & Plan:  Patient advised to continue all her medications. Also advised to cut down  on her cigarette smoking.  But she admits that she is not ready to quit at this time. Problem List Items Addressed This Visit     COPD, moderate (HCC)   GERD (gastroesophageal reflux disease)   Nicotine dependence, uncomplicated   Mixed hyperlipidemia   Essential hypertension, benign   GAD (generalized anxiety disorder)   Other Visit Diagnoses       Medicare annual wellness visit, subsequent    -  Primary     Nicotine abuse       Relevant Orders   CT CHEST WO CONTRAST     Colon cancer screening       Relevant Orders   Cologuard     Screening for osteoporosis       Relevant Orders   DG Bone Density       Return in about 3 months (around 03/19/2023).   Total time spent: 30 minutes  Margaretann Loveless, MD  12/19/2022   This document may have been prepared by [facility] Voice Recognition software and as such may include unintentional dictation errors.

## 2023-01-02 ENCOUNTER — Other Ambulatory Visit: Payer: Self-pay | Admitting: Family

## 2023-01-08 DIAGNOSIS — Z1211 Encounter for screening for malignant neoplasm of colon: Secondary | ICD-10-CM | POA: Diagnosis not present

## 2023-01-16 ENCOUNTER — Other Ambulatory Visit: Payer: Medicare HMO

## 2023-01-17 ENCOUNTER — Encounter: Payer: Self-pay | Admitting: Internal Medicine

## 2023-01-17 LAB — COLOGUARD: COLOGUARD: NEGATIVE

## 2023-01-23 NOTE — Progress Notes (Signed)
Patient notified

## 2023-02-08 ENCOUNTER — Ambulatory Visit: Payer: Self-pay | Admitting: Family Medicine

## 2023-02-17 ENCOUNTER — Other Ambulatory Visit: Payer: Self-pay | Admitting: Internal Medicine

## 2023-02-17 DIAGNOSIS — M75121 Complete rotator cuff tear or rupture of right shoulder, not specified as traumatic: Secondary | ICD-10-CM

## 2023-02-28 ENCOUNTER — Other Ambulatory Visit: Payer: Self-pay | Admitting: Internal Medicine

## 2023-03-19 ENCOUNTER — Ambulatory Visit (INDEPENDENT_AMBULATORY_CARE_PROVIDER_SITE_OTHER): Payer: Medicare HMO | Admitting: Internal Medicine

## 2023-03-19 ENCOUNTER — Encounter: Payer: Self-pay | Admitting: Internal Medicine

## 2023-03-19 VITALS — BP 160/88 | HR 83 | Ht 62.0 in | Wt 121.0 lb

## 2023-03-19 DIAGNOSIS — J449 Chronic obstructive pulmonary disease, unspecified: Secondary | ICD-10-CM

## 2023-03-19 DIAGNOSIS — I10 Essential (primary) hypertension: Secondary | ICD-10-CM | POA: Diagnosis not present

## 2023-03-19 DIAGNOSIS — F411 Generalized anxiety disorder: Secondary | ICD-10-CM | POA: Diagnosis not present

## 2023-03-19 DIAGNOSIS — K219 Gastro-esophageal reflux disease without esophagitis: Secondary | ICD-10-CM | POA: Diagnosis not present

## 2023-03-19 DIAGNOSIS — E782 Mixed hyperlipidemia: Secondary | ICD-10-CM | POA: Diagnosis not present

## 2023-03-19 DIAGNOSIS — R7309 Other abnormal glucose: Secondary | ICD-10-CM | POA: Diagnosis not present

## 2023-03-19 DIAGNOSIS — Z720 Tobacco use: Secondary | ICD-10-CM | POA: Diagnosis not present

## 2023-03-19 NOTE — Progress Notes (Signed)
 Established Patient Office Visit  Subjective:  Patient ID: Leah Riley, female    DOB: [birth year]  Age: 68 y.o. MRN: [id number]  Chief Complaint  Patient presents with   Follow-up    3 month follow up    Patient comes in for her follow-up today.  Her blood pressure is very high.  Patient admits that she has not taken her Norvasc yet.  Also admits that she has been feeling some cold symptoms and took OTC cold and sinus medication.  She missed her low-dose CT chest for lung cancer screening.  Denies cough, no shortness of breath, no fevers or chills. Risks schedule her CT chest, check blood work today.  Also advised to go home and take her blood pressure medicine and should take it every day in the morning.  Also advised to avoid OTC decongestants.  Patient to return in 2 weeks to check blood pressure and adjust medications further.    No other concerns at this time.   Past Medical History:  Diagnosis Date   COPD (chronic obstructive pulmonary disease) (HCC)    GERD (gastroesophageal reflux disease)    Headache    migraines   Hypertension     Past Surgical History:  Procedure Laterality Date   ABDOMINAL HYSTERECTOMY     APPENDECTOMY     BREAST CYST EXCISION Right    age 65   BREAST SURGERY Right    cyst removed   CYST EXCISION Left 07/28/2015   Procedure: CYST REMOVAL OF SUBCUTANEOUS LEFT KNEE;  Surgeon: Christena Flake, MD;  Location: [facility];  Service: Orthopedics;  Laterality: Left;   FOOT SURGERY Right    x3   KNEE ARTHROSCOPY Left    x2   OOPHORECTOMY     SHOULDER ARTHROSCOPY Right 05/26/2015   Procedure: SHOULDER RIGHT LIMITED ARTHROSCOPIC DEBRIDEMENT ARTHROSCOPIC SUBACROMIAL DECOMPRESSION MINI OPEN ROTATOR CUFF REPAIR MINI OPEN BICEPS TENODESIS;  Surgeon: Christena Flake, MD;  Location: [facility];  Service: Orthopedics;  Laterality: Right;   TUBAL LIGATION      Social History   Socioeconomic History   Marital status: Divorced    Spouse name:  Not on file   Number of children: Not on file   Years of education: Not on file   Highest education level: Not on file  Occupational History   Not on file  Tobacco Use   Smoking status: Every Day    Current packs/day: 1.50    Average packs/day: 1.5 packs/day for 35.0 years (52.5 ttl pk-yrs)    Types: Cigarettes   Smokeless tobacco: Never   Tobacco comments:    0.5 pack a day   Substance and Sexual Activity   Alcohol use: Yes    Comment: occas - couple times per year   Drug use: Not on file   Sexual activity: Not on file  Other Topics Concern   Not on file  Social History Narrative   Not on file   Social Drivers of Health   Financial Resource Strain: Medium Risk (12/19/2022)   Overall Financial Resource Strain (CARDIA)    Difficulty of Paying Living Expenses: Somewhat hard  Food Insecurity: No Food Insecurity (12/19/2022)   Hunger Vital Sign    Worried About Running Out of Food in the Last Year: Never true    Ran Out of Food in the Last Year: Never true  Transportation Needs: No Transportation Needs (12/19/2022)   PRAPARE - Administrator, Civil Service (Medical): No  Lack of Transportation (Non-Medical): No  Physical Activity: Not on file  Stress: Stress Concern Present (12/19/2022)   [employer] of Occupational Health - Occupational Stress Questionnaire    Feeling of Stress : To some extent  Social Connections: Not on file  Intimate Partner Violence: Not At Risk (12/19/2022)   Humiliation, Afraid, Rape, and Kick questionnaire    Fear of Current or Ex-Partner: No    Emotionally Abused: No    Physically Abused: No    Sexually Abused: No    Family History  Problem Relation Age of Onset   Coronary artery disease Mother    Coronary artery disease Father     Allergies  Allergen Reactions   Gluten Meal Nausea And Vomiting    Itching, blistering   Sulfa Antibiotics Swelling    Rash, itching    Outpatient Medications Prior to Visit   Medication Sig   alendronate (FOSAMAX) 70 MG tablet Take 70 mg by mouth once a week.   amLODipine (NORVASC) 5 MG tablet Take 1 tablet (5 mg total) by mouth daily.   aspirin 81 MG tablet Take 81 mg by mouth daily.   atorvastatin (LIPITOR) 20 MG tablet Take 1 tablet (20 mg total) by mouth daily.   baclofen (LIORESAL) 10 MG tablet    buPROPion (WELLBUTRIN XL) 150 MG 24 hr tablet TAKE 1 TABLET BY MOUTH IN THE MORNING   Calcium Carbonate-Vitamin D (CALTRATE 600+D PO) Take by mouth 2 (two) times daily.   estradiol (CLIMARA - DOSED IN MG/24 HR) 0.1 mg/24hr patch APPLY 1 PATCH TOPICALLY ONCE A WEEK   fluticasone (FLONASE) 50 MCG/ACT nasal spray Place into both nostrils daily.   gabapentin (NEURONTIN) 300 MG capsule Take 1 capsule by mouth twice daily   meloxicam (MOBIC) 15 MG tablet Take 1 tablet by mouth once daily   mirtazapine (REMERON) 30 MG tablet Take 1 tablet by mouth once daily   montelukast (SINGULAIR) 10 MG tablet Take 1 tablet (10 mg total) by mouth daily.   multivitamin-iron-minerals-folic acid (CENTRUM) chewable tablet Chew 1 tablet by mouth daily.   omega-3 acid ethyl esters (LOVAZA) 1 g capsule Take by mouth.   Omega-3 Fatty Acids (FISH OIL) 1000 MG CAPS Fish Oil 1000 MG Oral Capsule QTY: 0 capsule Days: 0 Refills: 0  Written: 06/23/14 Patient Instructions:   pantoprazole (PROTONIX) 40 MG tablet Take 1 tablet (40 mg total) by mouth daily.   solifenacin (VESICARE) 10 MG tablet Take 10 mg by mouth daily.   TRELEGY ELLIPTA 100-62.5-25 MCG/ACT AEPB Inhale 1 puff into the lungs daily.   VENTOLIN HFA 108 (90 Base) MCG/ACT inhaler    VITAMIN E PO Take by mouth daily.   No facility-administered medications prior to visit.    Review of Systems  Constitutional:  Negative for chills, fever and weight loss.  HENT:  Positive for congestion and sinus pain. Negative for sore throat.   Eyes: Negative.   Respiratory: Negative.  Negative for cough and shortness of breath.   Cardiovascular:  Negative.  Negative for chest pain, palpitations and leg swelling.  Gastrointestinal: Negative.  Negative for abdominal pain, constipation, diarrhea, heartburn, nausea and vomiting.  Genitourinary: Negative.  Negative for dysuria and flank pain.  Musculoskeletal: Negative.  Negative for joint pain and myalgias.  Skin: Negative.   Neurological:  Positive for headaches. Negative for dizziness, tingling and tremors.  Endo/Heme/Allergies: Negative.   Psychiatric/Behavioral: Negative.  Negative for depression and suicidal ideas. The patient is not nervous/anxious.  Objective:   BP (!) 160/88   Pulse 83   Ht 5\' 2"  (1.575 m)   Wt 121 lb (54.9 kg)   SpO2 93%   BMI 22.13 kg/m   Vitals:   03/19/23 1046  BP: (!) 160/88  Pulse: 83  Height: 5\' 2"  (1.575 m)  Weight: 121 lb (54.9 kg)  SpO2: 93%  BMI (Calculated): 22.13    Physical Exam Vitals and nursing note reviewed.  Constitutional:      Appearance: Normal appearance.  HENT:     Head: Normocephalic and atraumatic.     Nose: Nose normal.     Mouth/Throat:     Mouth: Mucous membranes are moist.     Pharynx: Oropharynx is clear.  Eyes:     Conjunctiva/sclera: Conjunctivae normal.     Pupils: Pupils are equal, round, and reactive to light.  Cardiovascular:     Rate and Rhythm: Normal rate and regular rhythm.     Pulses: Normal pulses.     Heart sounds: Normal heart sounds. No murmur heard. Pulmonary:     Effort: Pulmonary effort is normal.     Breath sounds: Normal breath sounds. No wheezing.  Abdominal:     General: Bowel sounds are normal.     Palpations: Abdomen is soft.     Tenderness: There is no abdominal tenderness. There is no right CVA tenderness or left CVA tenderness.  Musculoskeletal:        General: Normal range of motion.     Cervical back: Normal range of motion.     Right lower leg: No edema.     Left lower leg: No edema.  Skin:    General: Skin is warm and dry.  Neurological:     General: No  focal deficit present.     Mental Status: She is alert and oriented to person, place, and time.  Psychiatric:        Mood and Affect: Mood normal.        Behavior: Behavior normal.      No results found for any visits on 03/19/23.  Recent Results (from the past 2160 hours)  Cologuard     Status: None   Collection Time: 01/08/23 11:45 AM  Result Value Ref Range   COLOGUARD Negative Negative    Comment: NEGATIVE TEST RESULT. A negative Cologuard result indicates a low likelihood that a colorectal cancer (CRC) or advanced adenoma (adenomatous polyps with more advanced pre-malignant features)  is present. The chance that a person with a negative Cologuard test has a colorectal cancer is less than 1 in 1500 (negative predictive value >99.9%) or has an  advanced adenoma is less than  5.3% (negative predictive value 94.7%). These data are based on a prospective cross-sectional study of 10,000 individuals at average risk for colorectal cancer who were screened with both Cologuard and colonoscopy. (Imperiale T. et al, N Engl J Med 2014;370(14):1286-1297) The normal value (reference range) for this assay is negative.  COLOGUARD RE-SCREENING RECOMMENDATION: Periodic colorectal cancer screening is an important part of preventive healthcare for asymptomatic individuals at average risk for colorectal cancer.  Following a negative Cologuard result, the American Cancer Society and U.S.  Multi-Society Task Force screening guidelines recommend a Cologuard re-screening interval of 3 years.  References: American Cancer Society Guideline for Colorectal Cancer Screening: https://www.cancer.org/cancer/colon-rectal-cancer/detection-diagnosis-staging/acs-recommendations.html.; Rex DK, Boland CR, Dominitz JK, Colorectal Cancer Screening: Recommendations for Physicians and Patients from the U.S. Multi-Society Task Force on Colorectal Cancer Screening , Am J Gastroenterology 2017; 112:1016-1030.  TEST DESCRIPTION:  Composite algorithmic analysis of stool DNA-biomarkers with hemoglobin immunoassay.   Quantitative values of individual biomarkers are not reportable and are not associated with individual biomarker result reference ranges. Cologuard is intended for colorectal cancer screening of adults of either sex, 45 years or older, who are at average-risk for colorectal cancer (CRC). Cologuard has been approved for use by the U.S. FDA. The performance of Cologuard was  established in a cross sectional study of average-risk adults aged 35-84. Cologuard performance in patients ages 106 to 15 years was estimated by sub-group analysis of near-age groups. Colonoscopies performed for a positive result may find as the most clinically significant lesion: colorectal cancer [4.0%], advanced adenoma (including sessile serrated polyps greater than or equal to 1cm diameter) [20%] or non- advanced adenoma [31%]; or no colorectal neoplasia [45%]. These estimates are derived from a prospective cross-sectional screening study of 10,000 individuals at average risk for colorectal cancer who were screened with both Cologuard and colonoscopy. (Imperiale T. et al, Macy Mis J Med 2014;370(14):1286-1297.) Cologuard may produce a false negative or false positive result (no colorectal cancer or precancerous polyp present at colonoscopy follow up). A negative Cologuard test result does not guarantee the absence of CRC or advanced adenoma (pre-cancer). The current Cologuard  screening interval is every 3 years. Science writer and [place] Therapist, music). Cologuard performance data in a 10,000 patient pivotal study using colonoscopy as the reference method can be accessed at the following location: www.exactlabs.com/results. Additional description of the Cologuard test process, warnings and precautions can be found at www.cologuard.com.       Assessment & Plan:  Check labs today.  Take blood pressure medicine every morning.  Monitor  blood pressure.  Will adjust meds further if needed at follow-up. Problem List Items Addressed This Visit     COPD, moderate (HCC)   GERD (gastroesophageal reflux disease)   Relevant Orders   Ferritin   Mixed hyperlipidemia   Relevant Orders   Lipid Panel w/o Chol/HDL Ratio   Essential hypertension, benign - Primary   Relevant Orders   CBC with Diff   CMP14+EGFR   GAD (generalized anxiety disorder)   Other Visit Diagnoses       Nicotine abuse       Relevant Orders   CBC with Diff   CT CHEST WO CONTRAST       Return in about 2 weeks (around 04/02/2023).   Total time spent: 30 minutes  Margaretann Loveless, MD  03/19/2023   This document may have been prepared by [facility] Voice Recognition software and as such may include unintentional dictation errors.

## 2023-03-20 ENCOUNTER — Other Ambulatory Visit: Payer: Self-pay | Admitting: Internal Medicine

## 2023-03-20 DIAGNOSIS — R7309 Other abnormal glucose: Secondary | ICD-10-CM

## 2023-03-20 LAB — CMP14+EGFR
ALT: 13 IU/L (ref 0–32)
AST: 21 IU/L (ref 0–40)
Albumin: 4.4 g/dL (ref 3.9–4.9)
Alkaline Phosphatase: 96 IU/L (ref 44–121)
BUN/Creatinine Ratio: 15 (ref 12–28)
BUN: 16 mg/dL (ref 8–27)
Bilirubin Total: 0.3 mg/dL (ref 0.0–1.2)
CO2: 19 mmol/L — ABNORMAL LOW (ref 20–29)
Calcium: 9.2 mg/dL (ref 8.7–10.3)
Chloride: 100 mmol/L (ref 96–106)
Creatinine, Ser: 1.07 mg/dL — ABNORMAL HIGH (ref 0.57–1.00)
Globulin, Total: 2.4 g/dL (ref 1.5–4.5)
Glucose: 114 mg/dL — ABNORMAL HIGH (ref 70–99)
Potassium: 4.6 mmol/L (ref 3.5–5.2)
Sodium: 134 mmol/L (ref 134–144)
Total Protein: 6.8 g/dL (ref 6.0–8.5)
eGFR: 57 mL/min/{1.73_m2} — ABNORMAL LOW (ref >59)

## 2023-03-20 LAB — CBC WITH DIFFERENTIAL/PLATELET
Basophils Absolute: 0.1 10*3/uL (ref 0.0–0.2)
Basos: 1 %
EOS (ABSOLUTE): 0.3 10*3/uL (ref 0.0–0.4)
Eos: 3 %
Hematocrit: 37.4 % (ref 34.0–46.6)
Hemoglobin: 12.2 g/dL (ref 11.1–15.9)
Immature Grans (Abs): 0 10*3/uL (ref 0.0–0.1)
Immature Granulocytes: 0 %
Lymphocytes Absolute: 3 10*3/uL (ref 0.7–3.1)
Lymphs: 28 %
MCH: 31.4 pg (ref 26.6–33.0)
MCHC: 32.6 g/dL (ref 31.5–35.7)
MCV: 96 fL (ref 79–97)
Monocytes Absolute: 0.8 10*3/uL (ref 0.1–0.9)
Monocytes: 7 %
Neutrophils Absolute: 6.5 10*3/uL (ref 1.4–7.0)
Neutrophils: 61 %
Platelets: 310 10*3/uL (ref 150–450)
RBC: 3.89 x10E6/uL (ref 3.77–5.28)
RDW: 12.9 % (ref 11.7–15.4)
WBC: 10.7 10*3/uL (ref 3.4–10.8)

## 2023-03-20 LAB — LIPID PANEL W/O CHOL/HDL RATIO
Cholesterol, Total: 177 mg/dL (ref 100–199)
HDL: 57 mg/dL (ref >39)
LDL Chol Calc (NIH): 108 mg/dL — ABNORMAL HIGH (ref 0–99)
Triglycerides: 60 mg/dL (ref 0–149)
VLDL Cholesterol Cal: 12 mg/dL (ref 5–40)

## 2023-03-20 LAB — FERRITIN: Ferritin: 61 ng/mL (ref 15–150)

## 2023-03-20 LAB — HEMOGLOBIN A1C
Est. average glucose Bld gHb Est-mCnc: 108 mg/dL
Hgb A1c MFr Bld: 5.4 % (ref 4.8–5.6)

## 2023-03-20 LAB — SPECIMEN STATUS REPORT

## 2023-03-22 ENCOUNTER — Telehealth: Payer: Self-pay | Admitting: Internal Medicine

## 2023-03-22 NOTE — Telephone Encounter (Signed)
 Patient wants Rx for Chantix to try to quit smoking. Please advise.

## 2023-03-23 ENCOUNTER — Other Ambulatory Visit: Payer: Self-pay | Admitting: Internal Medicine

## 2023-03-23 DIAGNOSIS — J449 Chronic obstructive pulmonary disease, unspecified: Secondary | ICD-10-CM

## 2023-03-23 DIAGNOSIS — Z720 Tobacco use: Secondary | ICD-10-CM

## 2023-03-23 MED ORDER — VARENICLINE TARTRATE 1 MG PO TABS: 1.0000 mg | ORAL_TABLET | Freq: Two times a day (BID) | ORAL | 3 refills | Status: DC

## 2023-03-23 MED ORDER — VARENICLINE TARTRATE 0.5 MG PO TABS
0.5000 mg | ORAL_TABLET | Freq: Two times a day (BID) | ORAL | 0 refills | Status: AC
Start: 2023-03-23 — End: ?

## 2023-03-27 ENCOUNTER — Other Ambulatory Visit: Payer: Self-pay | Admitting: Family

## 2023-03-27 ENCOUNTER — Ambulatory Visit

## 2023-04-02 ENCOUNTER — Encounter: Payer: Self-pay | Admitting: Internal Medicine

## 2023-04-02 ENCOUNTER — Ambulatory Visit (INDEPENDENT_AMBULATORY_CARE_PROVIDER_SITE_OTHER): Admitting: Internal Medicine

## 2023-04-02 VITALS — BP 138/80 | HR 80 | Ht 62.0 in | Wt 120.4 lb

## 2023-04-02 DIAGNOSIS — E782 Mixed hyperlipidemia: Secondary | ICD-10-CM

## 2023-04-02 DIAGNOSIS — I10 Essential (primary) hypertension: Secondary | ICD-10-CM

## 2023-04-02 DIAGNOSIS — J449 Chronic obstructive pulmonary disease, unspecified: Secondary | ICD-10-CM

## 2023-04-02 DIAGNOSIS — F411 Generalized anxiety disorder: Secondary | ICD-10-CM

## 2023-04-02 DIAGNOSIS — J301 Allergic rhinitis due to pollen: Secondary | ICD-10-CM

## 2023-04-02 MED ORDER — AMLODIPINE BESY-BENAZEPRIL HCL 5-10 MG PO CAPS: 1.0000 | ORAL_CAPSULE | Freq: Every day | ORAL | 11 refills | Status: DC

## 2023-04-02 NOTE — Progress Notes (Signed)
 Established Patient Office Visit  Subjective:  Patient ID: Leah Riley, female    DOB: 1955/12/04  Age: 68 y.o. MRN: [id number]  Chief Complaint  Patient presents with   Follow-up    2 week follow up    Patient comes in for follow-up of her hypertension.  Although it is better than before but are still elevated.  Patient admits that she takes her Norvasc 5 mg/day late in the morning.  Will switch to Lotrel 5/10 mg/day.  Which she will start from today.  Currently she is having some seasonal allergies and has her medications which she is supposed to use regularly.  No fevers or chills, no productive cough.  Labs discussed.    No other concerns at this time.   Past Medical History:  Diagnosis Date   COPD (chronic obstructive pulmonary disease) (HCC)    GERD (gastroesophageal reflux disease)    Headache    migraines   Hypertension     Past Surgical History:  Procedure Laterality Date   ABDOMINAL HYSTERECTOMY     APPENDECTOMY     BREAST CYST EXCISION Right    age 22   BREAST SURGERY Right    cyst removed   CYST EXCISION Left 07/28/2015   Procedure: CYST REMOVAL OF SUBCUTANEOUS LEFT KNEE;  Surgeon: Christena Flake, MD;  Location: [facility];  Service: Orthopedics;  Laterality: Left;   FOOT SURGERY Right    x3   KNEE ARTHROSCOPY Left    x2   OOPHORECTOMY     SHOULDER ARTHROSCOPY Right 05/26/2015   Procedure: SHOULDER RIGHT LIMITED ARTHROSCOPIC DEBRIDEMENT ARTHROSCOPIC SUBACROMIAL DECOMPRESSION MINI OPEN ROTATOR CUFF REPAIR MINI OPEN BICEPS TENODESIS;  Surgeon: Christena Flake, MD;  Location: [facility];  Service: Orthopedics;  Laterality: Right;   TUBAL LIGATION      Social History   Socioeconomic History   Marital status: Divorced    Spouse name: Not on file   Number of children: Not on file   Years of education: Not on file   Highest education level: Not on file  Occupational History   Not on file  Tobacco Use   Smoking status: Every Day    Current  packs/day: 1.50    Average packs/day: 1.5 packs/day for 35.0 years (52.5 ttl pk-yrs)    Types: Cigarettes   Smokeless tobacco: Never   Tobacco comments:    0.5 pack a day   Substance and Sexual Activity   Alcohol use: Yes    Comment: occas - couple times per year   Drug use: Not on file   Sexual activity: Not on file  Other Topics Concern   Not on file  Social History Narrative   Not on file   Social Drivers of Health   Financial Resource Strain: Medium Risk (12/19/2022)   Overall Financial Resource Strain (CARDIA)    Difficulty of Paying Living Expenses: Somewhat hard  Food Insecurity: No Food Insecurity (12/19/2022)   Hunger Vital Sign    Worried About Running Out of Food in the Last Year: Never true    Ran Out of Food in the Last Year: Never true  Transportation Needs: No Transportation Needs (12/19/2022)   PRAPARE - Administrator, Civil Service (Medical): No    Lack of Transportation (Non-Medical): No  Physical Activity: Not on file  Stress: Stress Concern Present (12/19/2022)   [employer] of Occupational Health - Occupational Stress Questionnaire    Feeling of Stress : To some  extent  Social Connections: Not on file  Intimate Partner Violence: Not At Risk (12/19/2022)   Humiliation, Afraid, Rape, and Kick questionnaire    Fear of Current or Ex-Partner: No    Emotionally Abused: No    Physically Abused: No    Sexually Abused: No    Family History  Problem Relation Age of Onset   Coronary artery disease Mother    Coronary artery disease Father     Allergies  Allergen Reactions   Gluten Meal Nausea And Vomiting    Itching, blistering   Sulfa Antibiotics Swelling    Rash, itching    Outpatient Medications Prior to Visit  Medication Sig   alendronate (FOSAMAX) 70 MG tablet Take 70 mg by mouth once a week.   aspirin 81 MG tablet Take 81 mg by mouth daily.   atorvastatin (LIPITOR) 20 MG tablet Take 1 tablet (20 mg total) by mouth daily.    baclofen (LIORESAL) 10 MG tablet    buPROPion (WELLBUTRIN XL) 150 MG 24 hr tablet TAKE 1 TABLET BY MOUTH IN THE MORNING   Calcium Carbonate-Vitamin D (CALTRATE 600+D PO) Take by mouth 2 (two) times daily.   estradiol (CLIMARA - DOSED IN MG/24 HR) 0.1 mg/24hr patch APPLY 1 PATCH TOPICALLY ONCE A WEEK   fluticasone (FLONASE) 50 MCG/ACT nasal spray Place into both nostrils daily.   gabapentin (NEURONTIN) 300 MG capsule Take 1 capsule by mouth twice daily   meloxicam (MOBIC) 15 MG tablet Take 1 tablet by mouth once daily   mirtazapine (REMERON) 30 MG tablet Take 1 tablet by mouth once daily   montelukast (SINGULAIR) 10 MG tablet Take 1 tablet (10 mg total) by mouth daily.   multivitamin-iron-minerals-folic acid (CENTRUM) chewable tablet Chew 1 tablet by mouth daily.   omega-3 acid ethyl esters (LOVAZA) 1 g capsule Take by mouth.   Omega-3 Fatty Acids (FISH OIL) 1000 MG CAPS Fish Oil 1000 MG Oral Capsule QTY: 0 capsule Days: 0 Refills: 0  Written: 06/23/14 Patient Instructions:   pantoprazole (PROTONIX) 40 MG tablet Take 1 tablet (40 mg total) by mouth daily.   solifenacin (VESICARE) 10 MG tablet Take 10 mg by mouth daily.   TRELEGY ELLIPTA 100-62.5-25 MCG/ACT AEPB Inhale 1 puff into the lungs daily.   varenicline (CHANTIX CONTINUING MONTH PAK) 1 MG tablet Take 1 tablet (1 mg total) by mouth 2 (two) times daily.   varenicline (CHANTIX) 0.5 MG tablet Take 1 tablet (0.5 mg total) by mouth 2 (two) times daily.   VENTOLIN HFA 108 (90 Base) MCG/ACT inhaler    VITAMIN E PO Take by mouth daily.   [DISCONTINUED] amLODipine (NORVASC) 5 MG tablet Take 1 tablet (5 mg total) by mouth daily.   No facility-administered medications prior to visit.    Review of Systems  Constitutional: Negative.  Negative for chills, fever, malaise/fatigue and weight loss.  HENT:  Positive for congestion. Negative for sore throat.   Eyes: Negative.   Respiratory: Negative.  Negative for cough and shortness of breath.    Cardiovascular: Negative.  Negative for chest pain, palpitations and leg swelling.  Gastrointestinal: Negative.  Negative for abdominal pain, blood in stool, constipation, diarrhea, heartburn, melena, nausea and vomiting.  Genitourinary: Negative.  Negative for dysuria and flank pain.  Musculoskeletal: Negative.  Negative for joint pain and myalgias.  Skin: Negative.   Neurological: Negative.  Negative for dizziness and headaches.  Endo/Heme/Allergies: Negative.   Psychiatric/Behavioral: Negative.  Negative for depression and suicidal ideas. The patient is not nervous/anxious.  Objective:   BP 138/80   Pulse 80   Ht 5\' 2"  (1.575 m)   Wt 120 lb 6.4 oz (54.6 kg)   SpO2 (!) 89%   BMI 22.02 kg/m   Vitals:   04/02/23 0858  BP: 138/80  Pulse: 80  Height: 5\' 2"  (1.575 m)  Weight: 120 lb 6.4 oz (54.6 kg)  SpO2: (!) 89%  BMI (Calculated): 22.02    Physical Exam Vitals and nursing note reviewed.  Constitutional:      General: She is not in acute distress.    Appearance: Normal appearance.  HENT:     Head: Normocephalic and atraumatic.     Nose: Nose normal.     Mouth/Throat:     Mouth: Mucous membranes are moist.     Pharynx: Oropharynx is clear.  Eyes:     Conjunctiva/sclera: Conjunctivae normal.     Pupils: Pupils are equal, round, and reactive to light.  Cardiovascular:     Rate and Rhythm: Normal rate and regular rhythm.     Pulses: Normal pulses.     Heart sounds: Normal heart sounds. No murmur heard. Pulmonary:     Effort: Pulmonary effort is normal.     Breath sounds: Normal breath sounds. No wheezing.  Abdominal:     General: Bowel sounds are normal.     Palpations: Abdomen is soft.     Tenderness: There is no abdominal tenderness. There is no right CVA tenderness or left CVA tenderness.  Musculoskeletal:        General: Normal range of motion.     Cervical back: Normal range of motion.     Right lower leg: No edema.     Left lower leg: No edema.   Skin:    General: Skin is warm and dry.  Neurological:     General: No focal deficit present.     Mental Status: She is alert and oriented to person, place, and time.  Psychiatric:        Mood and Affect: Mood normal.        Behavior: Behavior normal.      No results found for any visits on 04/02/23.  Recent Results (from the past 2160 hours)  Cologuard     Status: None   Collection Time: 01/08/23 11:45 AM  Result Value Ref Range   COLOGUARD Negative Negative    Comment: NEGATIVE TEST RESULT. A negative Cologuard result indicates a low likelihood that a colorectal cancer (CRC) or advanced adenoma (adenomatous polyps with more advanced pre-malignant features)  is present. The chance that a person with a negative Cologuard test has a colorectal cancer is less than 1 in 1500 (negative predictive value >99.9%) or has an  advanced adenoma is less than  5.3% (negative predictive value 94.7%). These data are based on a prospective cross-sectional study of 10,000 individuals at average risk for colorectal cancer who were screened with both Cologuard and colonoscopy. (Imperiale T. et al, N Engl J Med 2014;370(14):1286-1297) The normal value (reference range) for this assay is negative.  COLOGUARD RE-SCREENING RECOMMENDATION: Periodic colorectal cancer screening is an important part of preventive healthcare for asymptomatic individuals at average risk for colorectal cancer.  Following a negative Cologuard result, the American Cancer Society and U.S.  Multi-Society Task Force screening guidelines recommend a Cologuard re-screening interval of 3 years.  References: American Cancer Society Guideline for Colorectal Cancer Screening: https://www.cancer.org/cancer/colon-rectal-cancer/detection-diagnosis-staging/acs-recommendations.html.; Rex DK, Boland CR, Dominitz JK, Colorectal Cancer Screening: Recommendations for Physicians and Patients from the U.S.  Multi-Society Task Force on Colorectal Cancer  Screening , Am J Gastroenterology 2017; [id number].  TEST DESCRIPTION: Composite algorithmic analysis of stool DNA-biomarkers with hemoglobin immunoassay.   Quantitative values of individual biomarkers are not reportable and are not associated with individual biomarker result reference ranges. Cologuard is intended for colorectal cancer screening of adults of either sex, 45 years or older, who are at average-risk for colorectal cancer (CRC). Cologuard has been approved for use by the U.S. FDA. The performance of Cologuard was  established in a cross sectional study of average-risk adults aged 35-84. Cologuard performance in patients ages 17 to 9 years was estimated by sub-group analysis of near-age groups. Colonoscopies performed for a positive result may find as the most clinically significant lesion: colorectal cancer [4.0%], advanced adenoma (including sessile serrated polyps greater than or equal to 1cm diameter) [20%] or non- advanced adenoma [31%]; or no colorectal neoplasia [45%]. These estimates are derived from a prospective cross-sectional screening study of 10,000 individuals at average risk for colorectal cancer who were screened with both Cologuard and colonoscopy. (Imperiale T. et al, Macy Mis J Med 2014;370(14):1286-1297.) Cologuard may produce a false negative or false positive result (no colorectal cancer or precancerous polyp present at colonoscopy follow up). A negative Cologuard test result does not guarantee the absence of CRC or advanced adenoma (pre-cancer). The current Cologuard  screening interval is every 3 years. Science writer and [place] Therapist, music). Cologuard performance data in a 10,000 patient pivotal study using colonoscopy as the reference method can be accessed at the following location: www.exactlabs.com/results. Additional description of the Cologuard test process, warnings and precautions can be found at www.cologuard.com.   CBC with Diff     Status:  None   Collection Time: 03/19/23 11:22 AM  Result Value Ref Range   WBC 10.7 3.4 - 10.8 x10E3/uL   RBC 3.89 3.77 - 5.28 x10E6/uL   Hemoglobin 12.2 11.1 - 15.9 g/dL   Hematocrit 16.1 09.6 - 46.6 %   MCV 96 79 - 97 fL   MCH 31.4 26.6 - 33.0 pg   MCHC 32.6 31.5 - 35.7 g/dL   RDW 04.5 40.9 - 81.1 %   Platelets 310 150 - 450 x10E3/uL   Neutrophils 61 Not Estab. %   Lymphs 28 Not Estab. %   Monocytes 7 Not Estab. %   Eos 3 Not Estab. %   Basos 1 Not Estab. %   Neutrophils Absolute 6.5 1.4 - 7.0 x10E3/uL   Lymphocytes Absolute 3.0 0.7 - 3.1 x10E3/uL   Monocytes Absolute 0.8 0.1 - 0.9 x10E3/uL   EOS (ABSOLUTE) 0.3 0.0 - 0.4 x10E3/uL   Basophils Absolute 0.1 0.0 - 0.2 x10E3/uL   Immature Granulocytes 0 Not Estab. %   Immature Grans (Abs) 0.0 0.0 - 0.1 x10E3/uL  Ferritin     Status: None   Collection Time: 03/19/23 11:22 AM  Result Value Ref Range   Ferritin 61 15 - 150 ng/mL  Hemoglobin A1c     Status: None   Collection Time: 03/19/23 11:22 AM  Result Value Ref Range   Hgb A1c MFr Bld 5.4 4.8 - 5.6 %    Comment:          Prediabetes: 5.7 - 6.4          Diabetes: >6.4          Glycemic control for adults with diabetes: <7.0    Est. average glucose Bld gHb Est-mCnc 108 mg/dL  Specimen status report  Status: None   Collection Time: 03/19/23 11:22 AM  Result Value Ref Range   specimen status report Comment     Comment: Written Authorization Written Authorization Written Authorization Received. Authorization received from [facility] MD 03-20-2023 Logged by Victoriano Lain   Lipid Panel w/o Chol/HDL Ratio     Status: Abnormal   Collection Time: 03/19/23 11:24 AM  Result Value Ref Range   Cholesterol, Total 177 100 - 199 mg/dL   Triglycerides 60 0 - 149 mg/dL   HDL 57 >40 mg/dL   VLDL Cholesterol Cal 12 5 - 40 mg/dL   LDL Chol Calc (NIH) 981 (H) 0 - 99 mg/dL  [id number]     Status: Abnormal   Collection Time: 03/19/23 11:24 AM  Result Value Ref Range   Glucose 114 (H)  70 - 99 mg/dL   BUN 16 8 - 27 mg/dL   Creatinine, Ser 6.21 (H) 0.57 - 1.00 mg/dL   eGFR 57 (L) >30 QM/VHQ/4.69   BUN/Creatinine Ratio 15 12 - 28   Sodium 134 134 - 144 mmol/L   Potassium 4.6 3.5 - 5.2 mmol/L   Chloride 100 96 - 106 mmol/L   CO2 19 (L) 20 - 29 mmol/L   Calcium 9.2 8.7 - 10.3 mg/dL   Total Protein 6.8 6.0 - 8.5 g/dL   Albumin 4.4 3.9 - 4.9 g/dL   Globulin, Total 2.4 1.5 - 4.5 g/dL   Bilirubin Total 0.3 0.0 - 1.2 mg/dL   Alkaline Phosphatase 96 44 - 121 IU/L   AST 21 0 - 40 IU/L   ALT 13 0 - 32 IU/L      Assessment & Plan:  Switch to Lotrel 5/10 mg/day.  Strict diet control.  Avoid salt.  Monitor blood pressure at home. Problem List Items Addressed This Visit     COPD, moderate (HCC)   Mixed hyperlipidemia   Relevant Medications   amLODipine-benazepril (LOTREL) 5-10 MG capsule   Essential hypertension, benign - Primary   Relevant Medications   amLODipine-benazepril (LOTREL) 5-10 MG capsule   GAD (generalized anxiety disorder)   Seasonal allergic rhinitis due to pollen    Return in about 3 weeks (around 04/23/2023).   Total time spent: 25 minutes  Margaretann Loveless, MD  04/02/2023   This document may have been prepared by [facility] Voice Recognition software and as such may include unintentional dictation errors.

## 2023-04-10 ENCOUNTER — Other Ambulatory Visit

## 2023-04-23 ENCOUNTER — Ambulatory Visit: Admitting: Internal Medicine

## 2023-04-24 ENCOUNTER — Ambulatory Visit (INDEPENDENT_AMBULATORY_CARE_PROVIDER_SITE_OTHER): Admitting: Internal Medicine

## 2023-04-24 ENCOUNTER — Encounter: Payer: Self-pay | Admitting: Internal Medicine

## 2023-04-24 VITALS — BP 142/80 | HR 77 | Ht 62.0 in | Wt 121.0 lb

## 2023-04-24 DIAGNOSIS — J449 Chronic obstructive pulmonary disease, unspecified: Secondary | ICD-10-CM

## 2023-04-24 DIAGNOSIS — I10 Essential (primary) hypertension: Secondary | ICD-10-CM

## 2023-04-24 DIAGNOSIS — E782 Mixed hyperlipidemia: Secondary | ICD-10-CM

## 2023-04-24 DIAGNOSIS — F411 Generalized anxiety disorder: Secondary | ICD-10-CM | POA: Diagnosis not present

## 2023-04-24 DIAGNOSIS — R350 Frequency of micturition: Secondary | ICD-10-CM | POA: Diagnosis not present

## 2023-04-24 LAB — POCT URINALYSIS DIPSTICK
Bilirubin, UA: NEGATIVE
Glucose, UA: NEGATIVE
Ketones, UA: NEGATIVE
Leukocytes, UA: NEGATIVE
Nitrite, UA: NEGATIVE
Protein, UA: NEGATIVE
Spec Grav, UA: <=1.005 — AB (ref 1.010–1.025)
Urobilinogen, UA: 0.2 U/dL
pH, UA: 6 (ref 5.0–8.0)

## 2023-04-24 MED ORDER — AMLODIPINE BESY-BENAZEPRIL HCL 5-20 MG PO CAPS: 1.0000 | ORAL_CAPSULE | Freq: Every day | ORAL | 3 refills | Status: DC

## 2023-04-24 NOTE — Progress Notes (Signed)
 Established Patient Office Visit  Subjective:  Patient ID: Leah Riley, female    DOB: 1955-02-03  Age: 68 y.o. MRN: [id number]  Chief Complaint  Patient presents with   Follow-up    3 week follow up    Patient comes in for her follow-up today.  Her blood pressure medicine had been switched to Lotrel 5/10 daily.  She is tolerating it well but her blood pressure is still above normal.  Denies any nausea or vomiting or a dry cough.  Her COPD is stable at the moment.  No fevers no chills and no cough with expectoration. Will increase her Lotrel to 5/20 mg/day.    No other concerns at this time.   Past Medical History:  Diagnosis Date   COPD (chronic obstructive pulmonary disease) (HCC)    GERD (gastroesophageal reflux disease)    Headache    migraines   Hypertension     Past Surgical History:  Procedure Laterality Date   ABDOMINAL HYSTERECTOMY     APPENDECTOMY     BREAST CYST EXCISION Right    age 35   BREAST SURGERY Right    cyst removed   CYST EXCISION Left 07/28/2015   Procedure: CYST REMOVAL OF SUBCUTANEOUS LEFT KNEE;  Surgeon: Elner Hahn, MD;  Location: [facility];  Service: Orthopedics;  Laterality: Left;   FOOT SURGERY Right    x3   KNEE ARTHROSCOPY Left    x2   OOPHORECTOMY     SHOULDER ARTHROSCOPY Right 05/26/2015   Procedure: SHOULDER RIGHT LIMITED ARTHROSCOPIC DEBRIDEMENT ARTHROSCOPIC SUBACROMIAL DECOMPRESSION MINI OPEN ROTATOR CUFF REPAIR MINI OPEN BICEPS TENODESIS;  Surgeon: Elner Hahn, MD;  Location: [facility];  Service: Orthopedics;  Laterality: Right;   TUBAL LIGATION      Social History   Socioeconomic History   Marital status: Divorced    Spouse name: Not on file   Number of children: Not on file   Years of education: Not on file   Highest education level: Not on file  Occupational History   Not on file  Tobacco Use   Smoking status: Every Day    Current packs/day: 1.50    Average packs/day: 1.5 packs/day for 35.0  years (52.5 ttl pk-yrs)    Types: Cigarettes   Smokeless tobacco: Never   Tobacco comments:    0.5 pack a day   Substance and Sexual Activity   Alcohol use: Yes    Comment: occas - couple times per year   Drug use: Not on file   Sexual activity: Not on file  Other Topics Concern   Not on file  Social History Narrative   Not on file   Social Drivers of Health   Financial Resource Strain: Medium Risk (12/19/2022)   Overall Financial Resource Strain (CARDIA)    Difficulty of Paying Living Expenses: Somewhat hard  Food Insecurity: No Food Insecurity (12/19/2022)   Hunger Vital Sign    Worried About Running Out of Food in the Last Year: Never true    Ran Out of Food in the Last Year: Never true  Transportation Needs: No Transportation Needs (12/19/2022)   PRAPARE - Administrator, Civil Service (Medical): No    Lack of Transportation (Non-Medical): No  Physical Activity: Not on file  Stress: Stress Concern Present (12/19/2022)   [employer] of Occupational Health - Occupational Stress Questionnaire    Feeling of Stress : To some extent  Social Connections: Not on file  Intimate  Partner Violence: Not At Risk (12/19/2022)   Humiliation, Afraid, Rape, and Kick questionnaire    Fear of Current or Ex-Partner: No    Emotionally Abused: No    Physically Abused: No    Sexually Abused: No    Family History  Problem Relation Age of Onset   Coronary artery disease Mother    Coronary artery disease Father     Allergies  Allergen Reactions   Gluten Meal Nausea And Vomiting    Itching, blistering   Sulfa Antibiotics Swelling    Rash, itching    Outpatient Medications Prior to Visit  Medication Sig   alendronate (FOSAMAX) 70 MG tablet Take 70 mg by mouth once a week.   aspirin 81 MG tablet Take 81 mg by mouth daily.   atorvastatin  (LIPITOR) 20 MG tablet Take 1 tablet (20 mg total) by mouth daily.   baclofen (LIORESAL) 10 MG tablet    buPROPion (WELLBUTRIN XL)  150 MG 24 hr tablet TAKE 1 TABLET BY MOUTH IN THE MORNING   Calcium  Carbonate-Vitamin D (CALTRATE 600+D PO) Take by mouth 2 (two) times daily.   estradiol (CLIMARA - DOSED IN MG/24 HR) 0.1 mg/24hr patch APPLY 1 PATCH TOPICALLY ONCE A WEEK   fluticasone  (FLONASE) 50 MCG/ACT nasal spray Place into both nostrils daily.   gabapentin (NEURONTIN) 300 MG capsule Take 1 capsule by mouth twice daily   meloxicam (MOBIC) 15 MG tablet Take 1 tablet by mouth once daily   mirtazapine (REMERON) 30 MG tablet Take 1 tablet by mouth once daily   montelukast  (SINGULAIR ) 10 MG tablet Take 1 tablet (10 mg total) by mouth daily.   multivitamin-iron-minerals-folic acid (CENTRUM) chewable tablet Chew 1 tablet by mouth daily.   omega-3 acid ethyl esters (LOVAZA) 1 g capsule Take by mouth.   Omega-3 Fatty Acids (FISH OIL) 1000 MG CAPS Fish Oil 1000 MG Oral Capsule QTY: 0 capsule Days: 0 Refills: 0  Written: 06/23/14 Patient Instructions:   pantoprazole  (PROTONIX ) 40 MG tablet Take 1 tablet (40 mg total) by mouth daily.   solifenacin  (VESICARE ) 10 MG tablet Take 10 mg by mouth daily.   TRELEGY ELLIPTA  100-62.5-25 MCG/ACT AEPB Inhale 1 puff into the lungs daily.   varenicline  (CHANTIX  CONTINUING MONTH PAK) 1 MG tablet Take 1 tablet (1 mg total) by mouth 2 (two) times daily.   varenicline  (CHANTIX ) 0.5 MG tablet Take 1 tablet (0.5 mg total) by mouth 2 (two) times daily.   VENTOLIN HFA 108 (90 Base) MCG/ACT inhaler    VITAMIN E PO Take by mouth daily.   [DISCONTINUED] amLODipine -benazepril  (LOTREL) 5-10 MG capsule Take 1 capsule by mouth daily.   No facility-administered medications prior to visit.    Review of Systems  Constitutional: Negative.  Negative for chills, fever, malaise/fatigue and weight loss.  HENT: Negative.  Negative for sore throat.   Eyes: Negative.   Respiratory: Negative.  Negative for cough, sputum production, shortness of breath and wheezing.   Cardiovascular: Negative.  Negative for chest pain,  palpitations and leg swelling.  Gastrointestinal: Negative.  Negative for abdominal pain, constipation, diarrhea, heartburn, nausea and vomiting.  Genitourinary: Negative.  Negative for dysuria and flank pain.  Musculoskeletal: Negative.  Negative for joint pain and myalgias.  Skin: Negative.   Neurological: Negative.  Negative for dizziness and headaches.  Endo/Heme/Allergies: Negative.   Psychiatric/Behavioral: Negative.  Negative for depression and suicidal ideas. The patient is not nervous/anxious.        Objective:   BP (!) 142/80  Pulse 77   Ht 5\' 2"  (1.575 m)   Wt 121 lb (54.9 kg)   SpO2 96%   BMI 22.13 kg/m   Vitals:   04/24/23 0948  BP: (!) 142/80  Pulse: 77  Height: 5\' 2"  (1.575 m)  Weight: 121 lb (54.9 kg)  SpO2: 96%  BMI (Calculated): 22.13    Physical Exam Vitals and nursing note reviewed.  Constitutional:      Appearance: Normal appearance.  HENT:     Head: Normocephalic and atraumatic.     Nose: Nose normal.     Mouth/Throat:     Mouth: Mucous membranes are moist.     Pharynx: Oropharynx is clear.  Eyes:     Conjunctiva/sclera: Conjunctivae normal.     Pupils: Pupils are equal, round, and reactive to light.  Cardiovascular:     Rate and Rhythm: Normal rate and regular rhythm.     Pulses: Normal pulses.     Heart sounds: Normal heart sounds. No murmur heard. Pulmonary:     Effort: Pulmonary effort is normal.     Breath sounds: Normal breath sounds. No wheezing.  Abdominal:     General: Bowel sounds are normal.     Palpations: Abdomen is soft.     Tenderness: There is no abdominal tenderness. There is no right CVA tenderness or left CVA tenderness.  Musculoskeletal:        General: Normal range of motion.     Cervical back: Normal range of motion.     Right lower leg: No edema.     Left lower leg: No edema.  Skin:    General: Skin is warm and dry.  Neurological:     General: No focal deficit present.     Mental Status: She is alert and  oriented to person, place, and time.  Psychiatric:        Mood and Affect: Mood normal.        Behavior: Behavior normal.      Results for orders placed or performed in visit on 04/24/23  POCT Urinalysis Dipstick (81002)  Result Value Ref Range   Color, UA Yellow    Clarity, UA Clear    Glucose, UA Negative Negative   Bilirubin, UA Negative    Ketones, UA Negative    Spec Grav, UA <=1.005 (A) 1.010 - 1.025   Blood, UA Trace    pH, UA 6.0 5.0 - 8.0   Protein, UA Negative Negative   Urobilinogen, UA 0.2 0.2 or 1.0 E.U./dL   Nitrite, UA Negative    Leukocytes, UA Negative Negative   Appearance Clear    Odor No     Recent Results (from the past 2160 hours)  CBC with Diff     Status: None   Collection Time: 03/19/23 11:22 AM  Result Value Ref Range   WBC 10.7 3.4 - 10.8 x10E3/uL   RBC 3.89 3.77 - 5.28 x10E6/uL   Hemoglobin 12.2 11.1 - 15.9 g/dL   Hematocrit 57.8 46.9 - 46.6 %   MCV 96 79 - 97 fL   MCH 31.4 26.6 - 33.0 pg   MCHC 32.6 31.5 - 35.7 g/dL   RDW 62.9 52.8 - 41.3 %   Platelets 310 150 - 450 x10E3/uL   Neutrophils 61 Not Estab. %   Lymphs 28 Not Estab. %   Monocytes 7 Not Estab. %   Eos 3 Not Estab. %   Basos 1 Not Estab. %   Neutrophils Absolute 6.5 1.4 - 7.0  x10E3/uL   Lymphocytes Absolute 3.0 0.7 - 3.1 x10E3/uL   Monocytes Absolute 0.8 0.1 - 0.9 x10E3/uL   EOS (ABSOLUTE) 0.3 0.0 - 0.4 x10E3/uL   Basophils Absolute 0.1 0.0 - 0.2 x10E3/uL   Immature Granulocytes 0 Not Estab. %   Immature Grans (Abs) 0.0 0.0 - 0.1 x10E3/uL  Ferritin     Status: None   Collection Time: 03/19/23 11:22 AM  Result Value Ref Range   Ferritin 61 15 - 150 ng/mL  Hemoglobin A1c     Status: None   Collection Time: 03/19/23 11:22 AM  Result Value Ref Range   Hgb A1c MFr Bld 5.4 4.8 - 5.6 %    Comment:          Prediabetes: 5.7 - 6.4          Diabetes: >6.4          Glycemic control for adults with diabetes: <7.0    Est. average glucose Bld gHb Est-mCnc 108 mg/dL  Specimen  status report     Status: None   Collection Time: 03/19/23 11:22 AM  Result Value Ref Range   specimen status report Comment     Comment: Written Authorization Written Authorization Written Authorization Received. Authorization received from [facility] MD 03-20-2023 Logged by Adell Age   Lipid Panel w/o Chol/HDL Ratio     Status: Abnormal   Collection Time: 03/19/23 11:24 AM  Result Value Ref Range   Cholesterol, Total 177 100 - 199 mg/dL   Triglycerides 60 0 - 149 mg/dL   HDL 57 >36 mg/dL   VLDL Cholesterol Cal 12 5 - 40 mg/dL   LDL Chol Calc (NIH) 644 (H) 0 - 99 mg/dL  [id number]     Status: Abnormal   Collection Time: 03/19/23 11:24 AM  Result Value Ref Range   Glucose 114 (H) 70 - 99 mg/dL   BUN 16 8 - 27 mg/dL   Creatinine, Ser 6.38 (H) 0.57 - 1.00 mg/dL   eGFR 57 (L) >75 IE/PPI/9.51   BUN/Creatinine Ratio 15 12 - 28   Sodium 134 134 - 144 mmol/L   Potassium 4.6 3.5 - 5.2 mmol/L   Chloride 100 96 - 106 mmol/L   CO2 19 (L) 20 - 29 mmol/L   Calcium  9.2 8.7 - 10.3 mg/dL   Total Protein 6.8 6.0 - 8.5 g/dL   Albumin 4.4 3.9 - 4.9 g/dL   Globulin, Total 2.4 1.5 - 4.5 g/dL   Bilirubin Total 0.3 0.0 - 1.2 mg/dL   Alkaline Phosphatase 96 44 - 121 IU/L   AST 21 0 - 40 IU/L   ALT 13 0 - 32 IU/L  POCT Urinalysis Dipstick (88416)     Status: Abnormal   Collection Time: 04/24/23  9:53 AM  Result Value Ref Range   Color, UA Yellow    Clarity, UA Clear    Glucose, UA Negative Negative   Bilirubin, UA Negative    Ketones, UA Negative    Spec Grav, UA <=1.005 (A) 1.010 - 1.025   Blood, UA Trace    pH, UA 6.0 5.0 - 8.0   Protein, UA Negative Negative   Urobilinogen, UA 0.2 0.2 or 1.0 E.U./dL   Nitrite, UA Negative    Leukocytes, UA Negative Negative   Appearance Clear    Odor No       Assessment & Plan:  Increase dose of Lotrel to 5/20 mg.  Continue current medications. Problem List Items Addressed This Visit  COPD, moderate (HCC)   Mixed hyperlipidemia    Relevant Medications   amLODipine -benazepril  (LOTREL) 5-20 MG capsule   Essential hypertension, benign - Primary   Relevant Medications   amLODipine -benazepril  (LOTREL) 5-20 MG capsule   GAD (generalized anxiety disorder)   Other Visit Diagnoses       Urinary frequency       Relevant Orders   POCT Urinalysis Dipstick (82956) (Completed)       Return in about 4 weeks (around 05/22/2023).   Total time spent: 25 minutes  Aisha Hove, MD  04/24/2023   This document may have been prepared by [facility] Voice Recognition software and as such may include unintentional dictation errors.

## 2023-04-29 ENCOUNTER — Other Ambulatory Visit: Payer: Self-pay | Admitting: Internal Medicine

## 2023-04-29 DIAGNOSIS — N393 Stress incontinence (female) (male): Secondary | ICD-10-CM

## 2023-04-29 DIAGNOSIS — G8929 Other chronic pain: Secondary | ICD-10-CM

## 2023-05-07 ENCOUNTER — Telehealth: Payer: Self-pay | Admitting: Internal Medicine

## 2023-05-07 NOTE — Telephone Encounter (Signed)
 Patient left VM that [facility] called her and told her that her CT scan has been canceled because there hasn't been an [place] done for it.   Do you know anything about this?

## 2023-05-08 ENCOUNTER — Ambulatory Visit

## 2023-05-10 ENCOUNTER — Other Ambulatory Visit: Payer: Self-pay | Admitting: Internal Medicine

## 2023-05-10 ENCOUNTER — Encounter: Payer: Self-pay | Admitting: Internal Medicine

## 2023-05-11 ENCOUNTER — Other Ambulatory Visit: Payer: Self-pay

## 2023-05-11 ENCOUNTER — Other Ambulatory Visit: Payer: Self-pay | Admitting: Internal Medicine

## 2023-05-11 DIAGNOSIS — K219 Gastro-esophageal reflux disease without esophagitis: Secondary | ICD-10-CM

## 2023-05-11 DIAGNOSIS — Z0000 Encounter for general adult medical examination without abnormal findings: Secondary | ICD-10-CM

## 2023-05-11 DIAGNOSIS — F411 Generalized anxiety disorder: Secondary | ICD-10-CM

## 2023-05-11 DIAGNOSIS — I10 Essential (primary) hypertension: Secondary | ICD-10-CM

## 2023-05-11 DIAGNOSIS — F17200 Nicotine dependence, unspecified, uncomplicated: Secondary | ICD-10-CM

## 2023-05-11 DIAGNOSIS — Z13820 Encounter for screening for osteoporosis: Secondary | ICD-10-CM

## 2023-05-11 DIAGNOSIS — J449 Chronic obstructive pulmonary disease, unspecified: Secondary | ICD-10-CM

## 2023-05-11 DIAGNOSIS — Z1211 Encounter for screening for malignant neoplasm of colon: Secondary | ICD-10-CM

## 2023-05-11 DIAGNOSIS — Z720 Tobacco use: Secondary | ICD-10-CM

## 2023-05-11 DIAGNOSIS — E782 Mixed hyperlipidemia: Secondary | ICD-10-CM

## 2023-05-11 DIAGNOSIS — J8410 Pulmonary fibrosis, unspecified: Secondary | ICD-10-CM

## 2023-05-11 MED ORDER — ATORVASTATIN CALCIUM 20 MG PO TABS
20.0000 mg | ORAL_TABLET | Freq: Every day | ORAL | 3 refills | Status: DC
Start: 2023-05-11 — End: 2023-06-14

## 2023-05-14 ENCOUNTER — Ambulatory Visit: Admission: RE | Admit: 2023-05-14 | Source: Ambulatory Visit

## 2023-05-14 ENCOUNTER — Other Ambulatory Visit: Payer: Self-pay | Admitting: Internal Medicine

## 2023-05-15 ENCOUNTER — Ambulatory Visit
Admission: RE | Admit: 2023-05-15 | Discharge: 2023-05-15 | Disposition: A | Discharge status: Home / Self Care | Source: Ambulatory Visit | Attending: Family | Admitting: Family

## 2023-05-15 DIAGNOSIS — Z720 Tobacco use: Secondary | ICD-10-CM | POA: Insufficient documentation

## 2023-05-15 DIAGNOSIS — Z122 Encounter for screening for malignant neoplasm of respiratory organs: Secondary | ICD-10-CM | POA: Diagnosis not present

## 2023-05-15 DIAGNOSIS — F17210 Nicotine dependence, cigarettes, uncomplicated: Secondary | ICD-10-CM | POA: Insufficient documentation

## 2023-05-15 DIAGNOSIS — J439 Emphysema, unspecified: Secondary | ICD-10-CM | POA: Diagnosis not present

## 2023-05-15 DIAGNOSIS — I700 Atherosclerosis of aorta: Secondary | ICD-10-CM | POA: Diagnosis not present

## 2023-05-15 DIAGNOSIS — J849 Interstitial pulmonary disease, unspecified: Secondary | ICD-10-CM | POA: Insufficient documentation

## 2023-05-15 DIAGNOSIS — J8410 Pulmonary fibrosis, unspecified: Secondary | ICD-10-CM | POA: Insufficient documentation

## 2023-05-22 ENCOUNTER — Other Ambulatory Visit: Payer: Self-pay | Admitting: Internal Medicine

## 2023-05-22 DIAGNOSIS — M75121 Complete rotator cuff tear or rupture of right shoulder, not specified as traumatic: Secondary | ICD-10-CM

## 2023-05-24 ENCOUNTER — Encounter: Payer: Self-pay | Admitting: Internal Medicine

## 2023-05-24 ENCOUNTER — Ambulatory Visit (INDEPENDENT_AMBULATORY_CARE_PROVIDER_SITE_OTHER): Admitting: Internal Medicine

## 2023-05-24 VITALS — BP 130/70 | HR 66 | Ht 62.0 in | Wt 120.8 lb

## 2023-05-24 DIAGNOSIS — J449 Chronic obstructive pulmonary disease, unspecified: Secondary | ICD-10-CM

## 2023-05-24 DIAGNOSIS — I10 Essential (primary) hypertension: Secondary | ICD-10-CM

## 2023-05-24 DIAGNOSIS — E782 Mixed hyperlipidemia: Secondary | ICD-10-CM

## 2023-05-24 DIAGNOSIS — J301 Allergic rhinitis due to pollen: Secondary | ICD-10-CM | POA: Diagnosis not present

## 2023-05-24 DIAGNOSIS — F411 Generalized anxiety disorder: Secondary | ICD-10-CM

## 2023-05-24 MED ORDER — AMLODIPINE BESYLATE 10 MG PO TABS: 10.0000 mg | ORAL_TABLET | Freq: Every day | ORAL | 11 refills | Status: AC

## 2023-05-24 NOTE — Progress Notes (Signed)
 Established Patient Office Visit  Subjective:  Patient ID: Leah Riley, female    DOB: [birth year]  Age: 68 y.o. MRN: [id number]  Chief Complaint  Patient presents with   Follow-up    4 week follow up    Patient comes in for follow-up of her blood pressure.  It is looking much better now however she is having increased cough with increased dose of Lotrel to 5/20.  Patient already has history of chronic cough, smoker's cough due to COPD. Will DC Lotrel 5-20.  Increase dose of Norvasc  to 10 mg/day. Denies nausea or vomiting, no abdominal pain, no chest pain or palpitations.    No other concerns at this time.   Past Medical History:  Diagnosis Date   COPD (chronic obstructive pulmonary disease) (HCC)    GERD (gastroesophageal reflux disease)    Headache    migraines   Hypertension     Past Surgical History:  Procedure Laterality Date   ABDOMINAL HYSTERECTOMY     APPENDECTOMY     BREAST CYST EXCISION Right    age 51   BREAST SURGERY Right    cyst removed   CYST EXCISION Left 07/28/2015   Procedure: CYST REMOVAL OF SUBCUTANEOUS LEFT KNEE;  Surgeon: Elner Hahn, MD;  Location: [facility];  Service: Orthopedics;  Laterality: Left;   FOOT SURGERY Right    x3   KNEE ARTHROSCOPY Left    x2   OOPHORECTOMY     SHOULDER ARTHROSCOPY Right 05/26/2015   Procedure: SHOULDER RIGHT LIMITED ARTHROSCOPIC DEBRIDEMENT ARTHROSCOPIC SUBACROMIAL DECOMPRESSION MINI OPEN ROTATOR CUFF REPAIR MINI OPEN BICEPS TENODESIS;  Surgeon: Elner Hahn, MD;  Location: [facility];  Service: Orthopedics;  Laterality: Right;   TUBAL LIGATION      Social History   Socioeconomic History   Marital status: Divorced    Spouse name: Not on file   Number of children: Not on file   Years of education: Not on file   Highest education level: Not on file  Occupational History   Not on file  Tobacco Use   Smoking status: Every Day    Current packs/day: 1.50    Average packs/day: 1.5  packs/day for 35.0 years (52.5 ttl pk-yrs)    Types: Cigarettes   Smokeless tobacco: Never   Tobacco comments:    0.5 pack a day   Substance and Sexual Activity   Alcohol use: Yes    Comment: occas - couple times per year   Drug use: Not on file   Sexual activity: Not on file  Other Topics Concern   Not on file  Social History Narrative   Not on file   Social Drivers of Health   Financial Resource Strain: Medium Risk (12/19/2022)   Overall Financial Resource Strain (CARDIA)    Difficulty of Paying Living Expenses: Somewhat hard  Food Insecurity: No Food Insecurity (12/19/2022)   Hunger Vital Sign    Worried About Running Out of Food in the Last Year: Never true    Ran Out of Food in the Last Year: Never true  Transportation Needs: No Transportation Needs (12/19/2022)   PRAPARE - Administrator, Civil Service (Medical): No    Lack of Transportation (Non-Medical): No  Physical Activity: Not on file  Stress: Stress Concern Present (12/19/2022)   [employer] of Occupational Health - Occupational Stress Questionnaire    Feeling of Stress : To some extent  Social Connections: Not on file  Intimate Partner Violence:  Not At Risk (12/19/2022)   Humiliation, Afraid, Rape, and Kick questionnaire    Fear of Current or Ex-Partner: No    Emotionally Abused: No    Physically Abused: No    Sexually Abused: No    Family History  Problem Relation Age of Onset   Coronary artery disease Mother    Coronary artery disease Father     Allergies  Allergen Reactions   Ace Inhibitors Cough   Gluten Meal Nausea And Vomiting    Itching, blistering   Sulfa Antibiotics Swelling    Rash, itching    Outpatient Medications Prior to Visit  Medication Sig   alendronate (FOSAMAX) 70 MG tablet Take 70 mg by mouth once a week.   aspirin 81 MG tablet Take 81 mg by mouth daily.   atorvastatin  (LIPITOR) 20 MG tablet Take 1 tablet by mouth once daily   atorvastatin  (LIPITOR) 20 MG  tablet Take 1 tablet (20 mg total) by mouth daily.   baclofen (LIORESAL) 10 MG tablet    buPROPion (WELLBUTRIN XL) 150 MG 24 hr tablet TAKE 1 TABLET BY MOUTH IN THE MORNING   Calcium  Carbonate-Vitamin D (CALTRATE 600+D PO) Take by mouth 2 (two) times daily.   estradiol (CLIMARA - DOSED IN MG/24 HR) 0.1 mg/24hr patch APPLY 1 PATCH TOPICALLY ONCE A WEEK   fluticasone  (FLONASE) 50 MCG/ACT nasal spray Place into both nostrils daily.   gabapentin (NEURONTIN) 300 MG capsule Take 1 capsule by mouth twice daily   meloxicam (MOBIC) 15 MG tablet Take 1 tablet by mouth once daily   mirtazapine (REMERON) 30 MG tablet Take 1 tablet by mouth once daily   montelukast  (SINGULAIR ) 10 MG tablet Take 1 tablet (10 mg total) by mouth daily.   multivitamin-iron-minerals-folic acid (CENTRUM) chewable tablet Chew 1 tablet by mouth daily.   omega-3 acid ethyl esters (LOVAZA) 1 g capsule Take by mouth.   Omega-3 Fatty Acids (FISH OIL) 1000 MG CAPS Fish Oil 1000 MG Oral Capsule QTY: 0 capsule Days: 0 Refills: 0  Written: 06/23/14 Patient Instructions:   pantoprazole  (PROTONIX ) 40 MG tablet Take 1 tablet (40 mg total) by mouth daily.   solifenacin  (VESICARE ) 10 MG tablet Take 1 tablet by mouth once daily   TRELEGY ELLIPTA  100-62.5-25 MCG/ACT AEPB Inhale 1 puff into the lungs daily.   varenicline  (CHANTIX ) 0.5 MG tablet Take 1 tablet (0.5 mg total) by mouth 2 (two) times daily.   VENTOLIN HFA 108 (90 Base) MCG/ACT inhaler    VITAMIN E PO Take by mouth daily.   [DISCONTINUED] amLODipine -benazepril  (LOTREL) 5-20 MG capsule Take 1 capsule by mouth daily.   varenicline  (CHANTIX  CONTINUING MONTH PAK) 1 MG tablet Take 1 tablet (1 mg total) by mouth 2 (two) times daily. (Patient not taking: Reported on 05/24/2023)   No facility-administered medications prior to visit.    Review of Systems  Constitutional: Negative.  Negative for chills, fever, malaise/fatigue and weight loss.  HENT:  Positive for congestion. Negative for sore  throat.   Eyes: Negative.   Respiratory:  Positive for cough. Negative for sputum production, shortness of breath and wheezing.   Cardiovascular: Negative.  Negative for chest pain, palpitations and leg swelling.  Gastrointestinal: Negative.  Negative for abdominal pain, constipation, diarrhea, heartburn, nausea and vomiting.  Genitourinary: Negative.  Negative for dysuria and flank pain.  Musculoskeletal: Negative.  Negative for joint pain and myalgias.  Skin: Negative.   Neurological: Negative.  Negative for dizziness, tingling, tremors and headaches.  Endo/Heme/Allergies: Negative.  Psychiatric/Behavioral: Negative.  Negative for depression and suicidal ideas. The patient is not nervous/anxious.        Objective:   BP 130/70   Pulse 66   Ht 5\' 2"  (1.575 m)   Wt 120 lb 12.8 oz (54.8 kg)   SpO2 96%   BMI 22.09 kg/m   Vitals:   05/24/23 0907  BP: 130/70  Pulse: 66  Height: 5\' 2"  (1.575 m)  Weight: 120 lb 12.8 oz (54.8 kg)  SpO2: 96%  BMI (Calculated): 22.09    Physical Exam Vitals and nursing note reviewed.  Constitutional:      Appearance: Normal appearance.  HENT:     Head: Normocephalic and atraumatic.     Nose: Nose normal.     Mouth/Throat:     Mouth: Mucous membranes are moist.     Pharynx: Oropharynx is clear.  Eyes:     Conjunctiva/sclera: Conjunctivae normal.     Pupils: Pupils are equal, round, and reactive to light.  Cardiovascular:     Rate and Rhythm: Normal rate and regular rhythm.     Pulses: Normal pulses.     Heart sounds: Normal heart sounds. No murmur heard. Pulmonary:     Effort: Pulmonary effort is normal.     Breath sounds: Normal breath sounds. No wheezing.  Abdominal:     General: Bowel sounds are normal.     Palpations: Abdomen is soft.     Tenderness: There is no abdominal tenderness. There is no right CVA tenderness or left CVA tenderness.  Musculoskeletal:        General: Normal range of motion.     Cervical back: Normal range  of motion.     Right lower leg: No edema.     Left lower leg: No edema.  Skin:    General: Skin is warm and dry.  Neurological:     General: No focal deficit present.     Mental Status: She is alert and oriented to person, place, and time.  Psychiatric:        Mood and Affect: Mood normal.        Behavior: Behavior normal.      No results found for any visits on 05/24/23.  Recent Results (from the past 2160 hours)  CBC with Diff     Status: None   Collection Time: 03/19/23 11:22 AM  Result Value Ref Range   WBC 10.7 3.4 - 10.8 x10E3/uL   RBC 3.89 3.77 - 5.28 x10E6/uL   Hemoglobin 12.2 11.1 - 15.9 g/dL   Hematocrit 30.8 65.7 - 46.6 %   MCV 96 79 - 97 fL   MCH 31.4 26.6 - 33.0 pg   MCHC 32.6 31.5 - 35.7 g/dL   RDW 84.6 96.2 - 95.2 %   Platelets 310 150 - 450 x10E3/uL   Neutrophils 61 Not Estab. %   Lymphs 28 Not Estab. %   Monocytes 7 Not Estab. %   Eos 3 Not Estab. %   Basos 1 Not Estab. %   Neutrophils Absolute 6.5 1.4 - 7.0 x10E3/uL   Lymphocytes Absolute 3.0 0.7 - 3.1 x10E3/uL   Monocytes Absolute 0.8 0.1 - 0.9 x10E3/uL   EOS (ABSOLUTE) 0.3 0.0 - 0.4 x10E3/uL   Basophils Absolute 0.1 0.0 - 0.2 x10E3/uL   Immature Granulocytes 0 Not Estab. %   Immature Grans (Abs) 0.0 0.0 - 0.1 x10E3/uL  Ferritin     Status: None   Collection Time: 03/19/23 11:22 AM  Result Value Ref  Range   Ferritin 61 15 - 150 ng/mL  Hemoglobin A1c     Status: None   Collection Time: 03/19/23 11:22 AM  Result Value Ref Range   Hgb A1c MFr Bld 5.4 4.8 - 5.6 %    Comment:          Prediabetes: 5.7 - 6.4          Diabetes: >6.4          Glycemic control for adults with diabetes: <7.0    Est. average glucose Bld gHb Est-mCnc 108 mg/dL  Specimen status report     Status: None   Collection Time: 03/19/23 11:22 AM  Result Value Ref Range   specimen status report Comment     Comment: Written Authorization Written Authorization Written Authorization Received. Authorization received from [facility] MD 03-20-2023 Logged by Adell Age   Lipid Panel w/o Chol/HDL Ratio     Status: Abnormal   Collection Time: 03/19/23 11:24 AM  Result Value Ref Range   Cholesterol, Total 177 100 - 199 mg/dL   Triglycerides 60 0 - 149 mg/dL   HDL 57 >16 mg/dL   VLDL Cholesterol Cal 12 5 - 40 mg/dL   LDL Chol Calc (NIH) 109 (H) 0 - 99 mg/dL  [id number]     Status: Abnormal   Collection Time: 03/19/23 11:24 AM  Result Value Ref Range   Glucose 114 (H) 70 - 99 mg/dL   BUN 16 8 - 27 mg/dL   Creatinine, Ser 1.91 (H) 0.57 - 1.00 mg/dL   eGFR 57 (L) >47 WG/NFA/2.13   BUN/Creatinine Ratio 15 12 - 28   Sodium 134 134 - 144 mmol/L   Potassium 4.6 3.5 - 5.2 mmol/L   Chloride 100 96 - 106 mmol/L   CO2 19 (L) 20 - 29 mmol/L   Calcium  9.2 8.7 - 10.3 mg/dL   Total Protein 6.8 6.0 - 8.5 g/dL   Albumin 4.4 3.9 - 4.9 g/dL   Globulin, Total 2.4 1.5 - 4.5 g/dL   Bilirubin Total 0.3 0.0 - 1.2 mg/dL   Alkaline Phosphatase 96 44 - 121 IU/L   AST 21 0 - 40 IU/L   ALT 13 0 - 32 IU/L  POCT Urinalysis Dipstick (08657)     Status: Abnormal   Collection Time: 04/24/23  9:53 AM  Result Value Ref Range   Color, UA Yellow    Clarity, UA Clear    Glucose, UA Negative Negative   Bilirubin, UA Negative    Ketones, UA Negative    Spec Grav, UA <=1.005 (A) 1.010 - 1.025   Blood, UA Trace    pH, UA 6.0 5.0 - 8.0   Protein, UA Negative Negative   Urobilinogen, UA 0.2 0.2 or 1.0 E.U./dL   Nitrite, UA Negative    Leukocytes, UA Negative Negative   Appearance Clear    Odor No       Assessment & Plan:  D/C Lotrel.  Increase dose of Norvasc  to 10 mg/day.  Continue other medications. Problem List Items Addressed This Visit     COPD, moderate (HCC)   Mixed hyperlipidemia   Relevant Medications   amLODipine  (NORVASC ) 10 MG tablet   Essential hypertension, benign - Primary   Relevant Medications   amLODipine  (NORVASC ) 10 MG tablet   GAD (generalized anxiety disorder)   Seasonal allergic rhinitis due to  pollen    Follow up 3 weeks.  Total time spent: 30 minutes  Aisha Hove, MD  05/24/2023  This document may have been prepared by [employer] Voice Recognition software and as such may include unintentional dictation errors.

## 2023-06-10 ENCOUNTER — Other Ambulatory Visit: Payer: Self-pay | Admitting: Internal Medicine

## 2023-06-10 DIAGNOSIS — G8929 Other chronic pain: Secondary | ICD-10-CM

## 2023-06-11 ENCOUNTER — Ambulatory Visit: Payer: Self-pay | Admitting: Internal Medicine

## 2023-06-13 NOTE — Progress Notes (Signed)
 Patient notified

## 2023-06-14 ENCOUNTER — Ambulatory Visit (INDEPENDENT_AMBULATORY_CARE_PROVIDER_SITE_OTHER): Admitting: Internal Medicine

## 2023-06-14 ENCOUNTER — Encounter: Payer: Self-pay | Admitting: Internal Medicine

## 2023-06-14 VITALS — BP 124/76 | HR 70 | Ht 62.0 in | Wt 119.0 lb

## 2023-06-14 DIAGNOSIS — I10 Essential (primary) hypertension: Secondary | ICD-10-CM | POA: Diagnosis not present

## 2023-06-14 DIAGNOSIS — F411 Generalized anxiety disorder: Secondary | ICD-10-CM | POA: Diagnosis not present

## 2023-06-14 DIAGNOSIS — J449 Chronic obstructive pulmonary disease, unspecified: Secondary | ICD-10-CM

## 2023-06-14 DIAGNOSIS — Z1231 Encounter for screening mammogram for malignant neoplasm of breast: Secondary | ICD-10-CM

## 2023-06-14 DIAGNOSIS — E782 Mixed hyperlipidemia: Secondary | ICD-10-CM | POA: Diagnosis not present

## 2023-06-14 DIAGNOSIS — J301 Allergic rhinitis due to pollen: Secondary | ICD-10-CM

## 2023-06-14 DIAGNOSIS — M810 Age-related osteoporosis without current pathological fracture: Secondary | ICD-10-CM | POA: Diagnosis not present

## 2023-06-14 DIAGNOSIS — R7309 Other abnormal glucose: Secondary | ICD-10-CM

## 2023-06-14 NOTE — Progress Notes (Signed)
 Established Patient Office Visit  Subjective:  Patient ID: Leah Riley, female    DOB: 08/14/1955  Age: 68 y.o. MRN: [id number]  Chief Complaint  Patient presents with   Follow-up    3 week follow up    Patient comes in for follow-up today.  Currently she is taking Norvasc  10 mg/day for blood pressure control, and it is looking quite well.  She was taken off Lotrel because of the cough.  And her cough has improved but not completely gone.  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  Patient needs to make a follow-up appointment with the pulmonologist.  She is taking Chantix  and and says it has helped her a lot.  However she smokes 1 cigarette here and there.    No other concerns at this time.   Past Medical History:  Diagnosis Date   COPD (chronic obstructive pulmonary disease) (HCC)    GERD (gastroesophageal reflux disease)    Headache    migraines   Hypertension     Past Surgical History:  Procedure Laterality Date   ABDOMINAL HYSTERECTOMY     APPENDECTOMY     BREAST CYST EXCISION Right    age 83   BREAST SURGERY Right    cyst removed   CYST EXCISION Left 07/28/2015   Procedure: CYST REMOVAL OF SUBCUTANEOUS LEFT KNEE;  Surgeon: Elner Hahn, MD;  Location: [facility];  Service: Orthopedics;  Laterality: Left;   FOOT SURGERY Right    x3   KNEE ARTHROSCOPY Left    x2   OOPHORECTOMY     SHOULDER ARTHROSCOPY Right 05/26/2015   Procedure: SHOULDER RIGHT LIMITED ARTHROSCOPIC DEBRIDEMENT ARTHROSCOPIC SUBACROMIAL DECOMPRESSION MINI OPEN ROTATOR CUFF REPAIR MINI OPEN BICEPS TENODESIS;  Surgeon: Elner Hahn, MD;  Location: [facility];  Service: Orthopedics;  Laterality: Right;   TUBAL LIGATION      Social History   Socioeconomic History   Marital status: Divorced    Spouse name: Not on file   Number  of children: Not on file   Years of education: Not on file   Highest education level: Not on file  Occupational History   Not on file  Tobacco Use   Smoking status: Every Day    Current packs/day: 1.50    Average packs/day: 1.5 packs/day for 35.0 years (52.5 ttl pk-yrs)    Types: Cigarettes   Smokeless tobacco: Never   Tobacco comments:    0.5 pack a day   Substance and Sexual Activity   Alcohol use: Yes    Comment: occas - couple times per year   Drug use: Not on file   Sexual activity: Not on file  Other Topics Concern   Not on file  Social History Narrative   Not on file   Social Drivers of Health   Financial Resource Strain: Medium Risk (12/19/2022)   Overall Financial Resource Strain (CARDIA)    Difficulty of Paying Living Expenses: Somewhat hard  Food Insecurity: No Food Insecurity (12/19/2022)   Hunger Vital Sign    Worried About Running Out of Food in the Last Year: Never true    Ran Out of Food in the Last Year: Never true  Transportation Needs: No Transportation Needs (12/19/2022)   PRAPARE - Administrator, Civil Service (Medical): No    Lack of Transportation (Non-Medical):  No  Physical Activity: Not on file  Stress: Stress Concern Present (12/19/2022)   [employer] of Occupational Health - Occupational Stress Questionnaire    Feeling of Stress : To some extent  Social Connections: Not on file  Intimate Partner Violence: Not At Risk (12/19/2022)   Humiliation, Afraid, Rape, and Kick questionnaire    Fear of Current or Ex-Partner: No    Emotionally Abused: No    Physically Abused: No    Sexually Abused: No    Family History  Problem Relation Age of Onset   Coronary artery disease Mother    Coronary artery disease Father     Allergies  Allergen Reactions   Ace Inhibitors Cough   Gluten Meal Nausea And Vomiting    Itching, blistering   Sulfa Antibiotics Swelling    Rash, itching    Outpatient Medications Prior to Visit   Medication Sig   alendronate (FOSAMAX) 70 MG tablet Take 70 mg by mouth once a week.   amLODipine  (NORVASC ) 10 MG tablet Take 1 tablet (10 mg total) by mouth daily.   aspirin 81 MG tablet Take 81 mg by mouth daily.   atorvastatin  (LIPITOR) 20 MG tablet Take 1 tablet by mouth once daily   baclofen (LIORESAL) 10 MG tablet    buPROPion (WELLBUTRIN XL) 150 MG 24 hr tablet TAKE 1 TABLET BY MOUTH IN THE MORNING   Calcium  Carbonate-Vitamin D (CALTRATE 600+D PO) Take by mouth 2 (two) times daily.   estradiol (CLIMARA - DOSED IN MG/24 HR) 0.1 mg/24hr patch APPLY 1 PATCH TOPICALLY ONCE A WEEK   fluticasone  (FLONASE) 50 MCG/ACT nasal spray Place into both nostrils daily.   gabapentin (NEURONTIN) 300 MG capsule Take 1 capsule by mouth twice daily   meloxicam (MOBIC) 15 MG tablet Take 1 tablet by mouth once daily   mirtazapine (REMERON) 30 MG tablet Take 1 tablet by mouth once daily   montelukast  (SINGULAIR ) 10 MG tablet Take 1 tablet (10 mg total) by mouth daily.   multivitamin-iron-minerals-folic acid (CENTRUM) chewable tablet Chew 1 tablet by mouth daily.   omega-3 acid ethyl esters (LOVAZA) 1 g capsule Take by mouth.   Omega-3 Fatty Acids (FISH OIL) 1000 MG CAPS Fish Oil 1000 MG Oral Capsule QTY: 0 capsule Days: 0 Refills: 0  Written: 06/23/14 Patient Instructions:   pantoprazole  (PROTONIX ) 40 MG tablet Take 1 tablet (40 mg total) by mouth daily.   solifenacin  (VESICARE ) 10 MG tablet Take 1 tablet by mouth once daily   TRELEGY ELLIPTA  100-62.5-25 MCG/ACT AEPB Inhale 1 puff into the lungs daily.   varenicline  (CHANTIX ) 0.5 MG tablet Take 1 tablet (0.5 mg total) by mouth 2 (two) times daily.   VENTOLIN HFA 108 (90 Base) MCG/ACT inhaler    VITAMIN E PO Take by mouth daily.   [DISCONTINUED] atorvastatin  (LIPITOR) 20 MG tablet Take 1 tablet (20 mg total) by mouth daily. (Patient not taking: Reported on 06/14/2023)   [DISCONTINUED] varenicline  (CHANTIX  CONTINUING MONTH PAK) 1 MG tablet Take 1 tablet (1 mg  total) by mouth 2 (two) times daily. (Patient not taking: Reported on 06/14/2023)   No facility-administered medications prior to visit.    Review of Systems  Constitutional: Negative.  Negative for chills, fever, malaise/fatigue and weight loss.  HENT: Negative.  Negative for sore throat.   Eyes: Negative.   Respiratory: Negative.  Negative for cough and shortness of breath.   Cardiovascular: Negative.  Negative for chest pain, palpitations and leg swelling.  Gastrointestinal: Negative.  Negative for  abdominal pain, constipation, diarrhea, heartburn, nausea and vomiting.  Genitourinary: Negative.  Negative for dysuria and flank pain.  Musculoskeletal: Negative.  Negative for joint pain and myalgias.  Skin: Negative.   Neurological: Negative.  Negative for dizziness, tingling, tremors and headaches.  Endo/Heme/Allergies: Negative.   Psychiatric/Behavioral: Negative.  Negative for depression and suicidal ideas. The patient is not nervous/anxious.        Objective:   BP 124/76   Pulse 70   Ht 5' 2 (1.575 m)   Wt 119 lb (54 kg)   SpO2 94%   BMI 21.77 kg/m   Vitals:   06/14/23 1027  BP: 124/76  Pulse: 70  Height: 5' 2 (1.575 m)  Weight: 119 lb (54 kg)  SpO2: 94%  BMI (Calculated): 21.76    Physical Exam Vitals and nursing note reviewed.  Constitutional:      Appearance: Normal appearance.  HENT:     Head: Normocephalic and atraumatic.     Nose: Nose normal.     Mouth/Throat:     Mouth: Mucous membranes are moist.     Pharynx: Oropharynx is clear.   Eyes:     Conjunctiva/sclera: Conjunctivae normal.     Pupils: Pupils are equal, round, and reactive to light.    Cardiovascular:     Rate and Rhythm: Normal rate and regular rhythm.     Pulses: Normal pulses.     Heart sounds: Normal heart sounds. No murmur heard. Pulmonary:     Effort: Pulmonary effort is normal.     Breath sounds: Normal breath sounds. No wheezing.  Abdominal:     General: Bowel sounds are  normal.     Palpations: Abdomen is soft.     Tenderness: There is no abdominal tenderness. There is no right CVA tenderness or left CVA tenderness.   Musculoskeletal:        General: Normal range of motion.     Cervical back: Normal range of motion.     Right lower leg: No edema.     Left lower leg: No edema.   Skin:    General: Skin is warm and dry.   Neurological:     General: No focal deficit present.     Mental Status: She is alert and oriented to person, place, and time.   Psychiatric:        Mood and Affect: Mood normal.        Behavior: Behavior normal.      No results found for any visits on 06/14/23.  Recent Results (from the past 2160 hours)  CBC with Diff     Status: None   Collection Time: 03/19/23 11:22 AM  Result Value Ref Range   WBC 10.7 3.4 - 10.8 x10E3/uL   RBC 3.89 3.77 - 5.28 x10E6/uL   Hemoglobin 12.2 11.1 - 15.9 g/dL   Hematocrit 81.1 91.4 - 46.6 %   MCV 96 79 - 97 fL   MCH 31.4 26.6 - 33.0 pg   MCHC 32.6 31.5 - 35.7 g/dL   RDW 78.2 95.6 - 21.3 %   Platelets 310 150 - 450 x10E3/uL   Neutrophils 61 Not Estab. %   Lymphs 28 Not Estab. %   Monocytes 7 Not Estab. %   Eos 3 Not Estab. %   Basos 1 Not Estab. %   Neutrophils Absolute 6.5 1.4 - 7.0 x10E3/uL   Lymphocytes Absolute 3.0 0.7 - 3.1 x10E3/uL   Monocytes Absolute 0.8 0.1 - 0.9 x10E3/uL   EOS (ABSOLUTE)  0.3 0.0 - 0.4 x10E3/uL   Basophils Absolute 0.1 0.0 - 0.2 x10E3/uL   Immature Granulocytes 0 Not Estab. %   Immature Grans (Abs) 0.0 0.0 - 0.1 x10E3/uL  Ferritin     Status: None   Collection Time: 03/19/23 11:22 AM  Result Value Ref Range   Ferritin 61 15 - 150 ng/mL  Hemoglobin A1c     Status: None   Collection Time: 03/19/23 11:22 AM  Result Value Ref Range   Hgb A1c MFr Bld 5.4 4.8 - 5.6 %    Comment:          Prediabetes: 5.7 - 6.4          Diabetes: >6.4          Glycemic control for adults with diabetes: <7.0    Est. average glucose Bld gHb Est-mCnc 108 mg/dL  Specimen  status report     Status: None   Collection Time: 03/19/23 11:22 AM  Result Value Ref Range   specimen status report Comment     Comment: Written Authorization Written Authorization Written Authorization Received. Authorization received from [facility] MD 03-20-2023 Logged by Adell Age   Lipid Panel w/o Chol/HDL Ratio     Status: Abnormal   Collection Time: 03/19/23 11:24 AM  Result Value Ref Range   Cholesterol, Total 177 100 - 199 mg/dL   Triglycerides 60 0 - 149 mg/dL   HDL 57 >57 mg/dL   VLDL Cholesterol Cal 12 5 - 40 mg/dL   LDL Chol Calc (NIH) 846 (H) 0 - 99 mg/dL  [id number]     Status: Abnormal   Collection Time: 03/19/23 11:24 AM  Result Value Ref Range   Glucose 114 (H) 70 - 99 mg/dL   BUN 16 8 - 27 mg/dL   Creatinine, Ser 3.24 (H) 0.57 - 1.00 mg/dL   eGFR 57 (L) >40 NU/UVO/5.36   BUN/Creatinine Ratio 15 12 - 28   Sodium 134 134 - 144 mmol/L   Potassium 4.6 3.5 - 5.2 mmol/L   Chloride 100 96 - 106 mmol/L   CO2 19 (L) 20 - 29 mmol/L   Calcium  9.2 8.7 - 10.3 mg/dL   Total Protein 6.8 6.0 - 8.5 g/dL   Albumin 4.4 3.9 - 4.9 g/dL   Globulin, Total 2.4 1.5 - 4.5 g/dL   Bilirubin Total 0.3 0.0 - 1.2 mg/dL   Alkaline Phosphatase 96 44 - 121 IU/L   AST 21 0 - 40 IU/L   ALT 13 0 - 32 IU/L  POCT Urinalysis Dipstick (64403)     Status: Abnormal   Collection Time: 04/24/23  9:53 AM  Result Value Ref Range   Color, UA Yellow    Clarity, UA Clear    Glucose, UA Negative Negative   Bilirubin, UA Negative    Ketones, UA Negative    Spec Grav, UA <=1.005 (A) 1.010 - 1.025   Blood, UA Trace    pH, UA 6.0 5.0 - 8.0   Protein, UA Negative Negative   Urobilinogen, UA 0.2 0.2 or 1.0 E.U./dL   Nitrite, UA Negative    Leukocytes, UA Negative Negative   Appearance Clear    Odor No       Assessment & Plan:  Continue current medications.  Needs to schedule mammogram and DEXA scan.  Follow-up with her pulmonologist.  Fasting labs. Problem List Items Addressed This  Visit     COPD, moderate (HCC)   Mixed hyperlipidemia   Relevant Orders   Lipid  Panel w/o Chol/HDL Ratio   Essential hypertension, benign - Primary   Relevant Orders   CMP14+EGFR   GAD (generalized anxiety disorder)   Seasonal allergic rhinitis due to pollen   Other Visit Diagnoses       Elevated glucose       Relevant Orders   Hemoglobin A1c     Age-related osteoporosis without current pathological fracture       Relevant Orders   DG Bone Density     Breast cancer screening by mammogram       Relevant Orders   MM 3D SCREENING MAMMOGRAM BILATERAL BREAST       Return in about 3 months (around 09/14/2023).   Total time spent: 30 minutes  Aisha Hove, MD  06/14/2023   This document may have been prepared by [facility] Voice Recognition software and as such may include unintentional dictation errors.

## 2023-06-15 LAB — CMP14+EGFR
ALT: 12 IU/L (ref 0–32)
AST: 24 IU/L (ref 0–40)
Albumin: 4.7 g/dL (ref 3.9–4.9)
Alkaline Phosphatase: 117 IU/L (ref 44–121)
BUN/Creatinine Ratio: 10 — ABNORMAL LOW (ref 12–28)
BUN: 11 mg/dL (ref 8–27)
Bilirubin Total: 0.5 mg/dL (ref 0.0–1.2)
CO2: 19 mmol/L — ABNORMAL LOW (ref 20–29)
Calcium: 9.8 mg/dL (ref 8.7–10.3)
Chloride: 94 mmol/L — ABNORMAL LOW (ref 96–106)
Creatinine, Ser: 1.08 mg/dL — ABNORMAL HIGH (ref 0.57–1.00)
Globulin, Total: 2.5 g/dL (ref 1.5–4.5)
Glucose: 91 mg/dL (ref 70–99)
Potassium: 4.5 mmol/L (ref 3.5–5.2)
Sodium: 133 mmol/L — ABNORMAL LOW (ref 134–144)
Total Protein: 7.2 g/dL (ref 6.0–8.5)
eGFR: 57 mL/min/{1.73_m2} — ABNORMAL LOW (ref >59)

## 2023-06-15 LAB — HEMOGLOBIN A1C
Est. average glucose Bld gHb Est-mCnc: 114 mg/dL
Hgb A1c MFr Bld: 5.6 % (ref 4.8–5.6)

## 2023-06-15 LAB — LIPID PANEL W/O CHOL/HDL RATIO
Cholesterol, Total: 189 mg/dL (ref 100–199)
HDL: 55 mg/dL (ref >39)
LDL Chol Calc (NIH): 114 mg/dL — ABNORMAL HIGH (ref 0–99)
Triglycerides: 113 mg/dL (ref 0–149)
VLDL Cholesterol Cal: 20 mg/dL (ref 5–40)

## 2023-06-18 ENCOUNTER — Ambulatory Visit: Payer: Self-pay | Admitting: Internal Medicine

## 2023-06-18 ENCOUNTER — Telehealth: Payer: Self-pay | Admitting: Internal Medicine

## 2023-06-18 NOTE — Telephone Encounter (Signed)
 Patient left VM that [facility] said they filled her mirtazapine 30 mg for a 90 DS but the patient said she cannot find it at her house.  Called patient back and left her a VM stating she can go on [employer] website and get a coupon for 60 tabs of her mirtazapine 30 mg for $18. That will get her through until August when her insurance will pay again.

## 2023-06-19 ENCOUNTER — Ambulatory Visit (INDEPENDENT_AMBULATORY_CARE_PROVIDER_SITE_OTHER)

## 2023-06-19 DIAGNOSIS — M8589 Other specified disorders of bone density and structure, multiple sites: Secondary | ICD-10-CM

## 2023-06-19 DIAGNOSIS — M810 Age-related osteoporosis without current pathological fracture: Secondary | ICD-10-CM | POA: Diagnosis not present

## 2023-06-25 ENCOUNTER — Other Ambulatory Visit: Payer: Self-pay | Admitting: Family

## 2023-06-25 DIAGNOSIS — Z79890 Hormone replacement therapy (postmenopausal): Secondary | ICD-10-CM

## 2023-06-25 NOTE — Progress Notes (Signed)
 Patient notified

## 2023-09-12 ENCOUNTER — Other Ambulatory Visit: Payer: Self-pay | Admitting: Internal Medicine

## 2023-09-12 DIAGNOSIS — F411 Generalized anxiety disorder: Secondary | ICD-10-CM

## 2023-09-14 ENCOUNTER — Ambulatory Visit: Admitting: Internal Medicine

## 2023-09-14 ENCOUNTER — Encounter: Payer: Self-pay | Admitting: Internal Medicine

## 2023-09-14 VITALS — BP 128/82 | HR 81 | Ht 62.0 in | Wt 119.8 lb

## 2023-09-14 DIAGNOSIS — L309 Dermatitis, unspecified: Secondary | ICD-10-CM | POA: Diagnosis not present

## 2023-09-14 DIAGNOSIS — E782 Mixed hyperlipidemia: Secondary | ICD-10-CM

## 2023-09-14 DIAGNOSIS — J449 Chronic obstructive pulmonary disease, unspecified: Secondary | ICD-10-CM

## 2023-09-14 DIAGNOSIS — L03114 Cellulitis of left upper limb: Secondary | ICD-10-CM

## 2023-09-14 DIAGNOSIS — F17210 Nicotine dependence, cigarettes, uncomplicated: Secondary | ICD-10-CM

## 2023-09-14 DIAGNOSIS — I10 Essential (primary) hypertension: Secondary | ICD-10-CM | POA: Diagnosis not present

## 2023-09-14 MED ORDER — DOXYCYCLINE HYCLATE 100 MG PO TABS: 100.0000 mg | ORAL_TABLET | Freq: Two times a day (BID) | ORAL | 0 refills | Status: AC

## 2023-09-14 NOTE — Progress Notes (Signed)
 Established Patient Office Visit  Subjective:  Patient ID: Leah Riley, female    DOB: [birth year]  Age: 68 y.o. MRN: [id number]  Chief Complaint  Patient presents with   Follow-up    3 month follow up    Patient is here today for routine follow up. She reports doing ok but has new complaints of painful dry skin cracking on left palm and heel of right foot. Patient reports putting lotion on left palm without relief of dry cracking skin. Left palm red and warm to touch around dry cracked skin. Encouraged patient to wear gloves when doing dishes or exposed to chemicals. Will send doxycycline  for 7 days for cellulitis and will discuss eczema relief cream when patient returns to follow up. Reports filing down dry cracked skin on right heel and applying lotion. Encouraged patient to stop filing dry cracked skin on heel. Encouraged clean warm water soaks for about 10 minutes before pat drying the feet and applying Vaseline and occlusive dressing for at least 30 minutes a day.  Reviewing patients screenings she had DEXA completed 06/2023 which showed improved osteopenia; she has not gotten her mammogram yet. It was ordered at her last visit; low dose chest Ct completed 05/2023; last colonoscopy 12/2022.  Patient states she is smoking approximately 1 cigarette a week while on Chantix . She states she only takes Chantix  once a day since she only eats once a day. Encouraged patient to take it twice daily as prescribed to reduce urge to smoke completely. Patient denies any headache, chest pain, shortness of breath, abdominal pain, nausea, vomiting/diarrhea at this time. Will order fasting labs to be collected when patient returns fasting early next week.     No other concerns at this time.   Past Medical History:  Diagnosis Date   COPD (chronic obstructive pulmonary disease) (HCC)    GERD (gastroesophageal reflux disease)    Headache    migraines   Hypertension     Past Surgical History:   Procedure Laterality Date   ABDOMINAL HYSTERECTOMY     APPENDECTOMY     BREAST CYST EXCISION Right    age 45   BREAST SURGERY Right    cyst removed   CYST EXCISION Left 07/28/2015   Procedure: CYST REMOVAL OF SUBCUTANEOUS LEFT KNEE;  Surgeon: Norleen JINNY Maltos, MD;  Location: [facility];  Service: Orthopedics;  Laterality: Left;   FOOT SURGERY Right    x3   KNEE ARTHROSCOPY Left    x2   OOPHORECTOMY     SHOULDER ARTHROSCOPY Right 05/26/2015   Procedure: SHOULDER RIGHT LIMITED ARTHROSCOPIC DEBRIDEMENT ARTHROSCOPIC SUBACROMIAL DECOMPRESSION MINI OPEN ROTATOR CUFF REPAIR MINI OPEN BICEPS TENODESIS;  Surgeon: Norleen JINNY Maltos, MD;  Location: [facility];  Service: Orthopedics;  Laterality: Right;   TUBAL LIGATION      Social History   Socioeconomic History   Marital status: Divorced    Spouse name: Not on file   Number of children: Not on file   Years of education: Not on file   Highest education level: Not on file  Occupational History   Not on file  Tobacco Use   Smoking status: Every Day    Current packs/day: 1.50    Average packs/day: 1.5 packs/day for 35.0 years (52.5 ttl pk-yrs)    Types: Cigarettes   Smokeless tobacco: Never   Tobacco comments:    0.5 pack a day   Substance and Sexual Activity   Alcohol use: Yes    Comment:  occas - couple times per year   Drug use: Not on file   Sexual activity: Not on file  Other Topics Concern   Not on file  Social History Narrative   Not on file   Social Drivers of Health   Financial Resource Strain: Medium Risk (12/19/2022)   Overall Financial Resource Strain (CARDIA)    Difficulty of Paying Living Expenses: Somewhat hard  Food Insecurity: No Food Insecurity (12/19/2022)   Hunger Vital Sign    Worried About Running Out of Food in the Last Year: Never true    Ran Out of Food in the Last Year: Never true  Transportation Needs: No Transportation Needs (12/19/2022)   PRAPARE - Scientist, research (physical sciences) (Medical): No    Lack of Transportation (Non-Medical): No  Physical Activity: Not on file  Stress: Stress Concern Present (12/19/2022)   [employer] of Occupational Health - Occupational Stress Questionnaire    Feeling of Stress : To some extent  Social Connections: Not on file  Intimate Partner Violence: Not At Risk (12/19/2022)   Humiliation, Afraid, Rape, and Kick questionnaire    Fear of Current or Ex-Partner: No    Emotionally Abused: No    Physically Abused: No    Sexually Abused: No    Family History  Problem Relation Age of Onset   Coronary artery disease Mother    Coronary artery disease Father     Allergies  Allergen Reactions   Ace Inhibitors Cough   Gluten Meal Nausea And Vomiting    Itching, blistering   Sulfa Antibiotics Swelling    Rash, itching    Outpatient Medications Prior to Visit  Medication Sig   alendronate (FOSAMAX) 70 MG tablet Take 70 mg by mouth once a week.   amLODipine  (NORVASC ) 10 MG tablet Take 1 tablet (10 mg total) by mouth daily.   aspirin 81 MG tablet Take 81 mg by mouth daily.   atorvastatin  (LIPITOR) 20 MG tablet Take 1 tablet by mouth once daily   baclofen (LIORESAL) 10 MG tablet    buPROPion (WELLBUTRIN XL) 150 MG 24 hr tablet TAKE 1 TABLET BY MOUTH IN THE MORNING   Calcium  Carbonate-Vitamin D (CALTRATE 600+D PO) Take by mouth 2 (two) times daily.   estradiol (CLIMARA - DOSED IN MG/24 HR) 0.1 mg/24hr patch APPLY 1 PATCH TOPICALLY ONCE A WEEK   fluticasone  (FLONASE) 50 MCG/ACT nasal spray Place into both nostrils daily.   gabapentin (NEURONTIN) 300 MG capsule Take 1 capsule by mouth twice daily   meloxicam (MOBIC) 15 MG tablet Take 1 tablet by mouth once daily   mirtazapine (REMERON) 30 MG tablet Take 1 tablet by mouth once daily   montelukast  (SINGULAIR ) 10 MG tablet Take 1 tablet (10 mg total) by mouth daily.   multivitamin-iron-minerals-folic acid (CENTRUM) chewable tablet Chew 1 tablet by mouth daily.    omega-3 acid ethyl esters (LOVAZA) 1 g capsule Take by mouth.   Omega-3 Fatty Acids (FISH OIL) 1000 MG CAPS Fish Oil 1000 MG Oral Capsule QTY: 0 capsule Days: 0 Refills: 0  Written: 06/23/14 Patient Instructions:   pantoprazole  (PROTONIX ) 40 MG tablet Take 1 tablet (40 mg total) by mouth daily.   solifenacin  (VESICARE ) 10 MG tablet Take 1 tablet by mouth once daily   TRELEGY ELLIPTA  100-62.5-25 MCG/ACT AEPB Inhale 1 puff into the lungs daily.   varenicline  (CHANTIX ) 0.5 MG tablet Take 1 tablet (0.5 mg total) by mouth 2 (two) times daily.   VENTOLIN HFA  108 (90 Base) MCG/ACT inhaler    VITAMIN E PO Take by mouth daily.   No facility-administered medications prior to visit.    Review of Systems  Constitutional: Negative.  Negative for chills, fever and malaise/fatigue.  HENT: Negative.  Negative for congestion and sore throat.   Eyes: Negative.  Negative for blurred vision and pain.  Respiratory: Negative.  Negative for cough and shortness of breath.   Cardiovascular: Negative.  Negative for chest pain, palpitations and leg swelling.  Gastrointestinal: Negative.  Negative for abdominal pain, blood in stool, constipation, diarrhea, heartburn, melena, nausea and vomiting.  Genitourinary: Negative.  Negative for dysuria, flank pain, frequency and urgency.  Musculoskeletal: Negative.  Negative for joint pain and myalgias.  Skin:  Positive for rash.       Dry cracked skin on left palm and right heel  Neurological: Negative.  Negative for dizziness, tingling, sensory change, weakness and headaches.  Endo/Heme/Allergies: Negative.   Psychiatric/Behavioral: Negative.  Negative for depression and suicidal ideas. The patient is not nervous/anxious.        Objective:   BP 128/82   Pulse 81   Ht 5' 2 (1.575 m)   Wt 119 lb 12.8 oz (54.3 kg)   SpO2 94%   BMI 21.91 kg/m   Vitals:   09/14/23 1043  BP: 128/82  Pulse: 81  Height: 5' 2 (1.575 m)  Weight: 119 lb 12.8 oz (54.3 kg)  SpO2: 94%   BMI (Calculated): 21.91    Physical Exam Vitals and nursing note reviewed.  Constitutional:      Appearance: Normal appearance.  HENT:     Head: Normocephalic and atraumatic.     Nose: Nose normal.     Mouth/Throat:     Mouth: Mucous membranes are moist.     Pharynx: Oropharynx is clear.  Eyes:     Conjunctiva/sclera: Conjunctivae normal.     Pupils: Pupils are equal, round, and reactive to light.  Cardiovascular:     Rate and Rhythm: Normal rate and regular rhythm.     Pulses: Normal pulses.     Heart sounds: Normal heart sounds. No murmur heard. Pulmonary:     Effort: Pulmonary effort is normal.     Breath sounds: Normal breath sounds. No wheezing.  Abdominal:     General: Bowel sounds are normal.     Palpations: Abdomen is soft.     Tenderness: There is no abdominal tenderness. There is no right CVA tenderness or left CVA tenderness.  Musculoskeletal:        General: Normal range of motion.     Cervical back: Normal range of motion.     Right lower leg: No edema.     Left lower leg: No edema.  Feet:     Right foot:     Skin integrity: Dry skin and fissure present.  Skin:    General: Skin is warm and dry.     Findings: Erythema and rash (eczema with cellulitis due to open cracked skin on left palm) present. Rash is scaling.  Neurological:     General: No focal deficit present.     Mental Status: She is alert and oriented to person, place, and time.  Psychiatric:        Mood and Affect: Mood normal.        Behavior: Behavior normal.      No results found for any visits on 09/14/23.  No results found for this or any previous visit (from the past 2160  hours).    Assessment & Plan:  Start doxycyline 100 mg twice daily for 7 days for cellulitis. Encouraged patient to wear gloves while doing dishes or while using other harsh chemicals. Encouraged patient to clean soak feet, pat dry, and then apply Vaseline and occlusive dressing for at least 30 minutes for dry  cracked skin on feet. Continue other medications as prescribed. Encouraged complete smoking cessation. Patient will return for fasting labs and then will follow up with patient on results. Problem List Items Addressed This Visit     Chronic obstructive pulmonary disease (HCC)   Nicotine dependence   Mixed hyperlipidemia   Relevant Orders   Lipid Panel w/o Chol/HDL Ratio   Benign essential hypertension   Cellulitis of hand, left - Primary   Relevant Medications   doxycycline  (VIBRA -TABS) 100 MG tablet   Eczema    Follow up in one week.  Total time spent: 30 minutes  FERNAND FREDY RAMAN, MD  09/14/2023   This document may have been prepared by [facility] Voice Recognition software and as such may include unintentional dictation errors.

## 2023-09-17 ENCOUNTER — Ambulatory Visit: Admitting: Internal Medicine

## 2023-09-20 ENCOUNTER — Encounter: Payer: Self-pay | Admitting: Internal Medicine

## 2023-09-20 ENCOUNTER — Ambulatory Visit (INDEPENDENT_AMBULATORY_CARE_PROVIDER_SITE_OTHER): Admitting: Internal Medicine

## 2023-09-20 VITALS — BP 164/84 | HR 78 | Ht 62.0 in | Wt 120.0 lb

## 2023-09-20 DIAGNOSIS — F17200 Nicotine dependence, unspecified, uncomplicated: Secondary | ICD-10-CM | POA: Diagnosis not present

## 2023-09-20 DIAGNOSIS — Z23 Encounter for immunization: Secondary | ICD-10-CM | POA: Insufficient documentation

## 2023-09-20 DIAGNOSIS — I10 Essential (primary) hypertension: Secondary | ICD-10-CM | POA: Diagnosis not present

## 2023-09-20 DIAGNOSIS — L309 Dermatitis, unspecified: Secondary | ICD-10-CM | POA: Diagnosis not present

## 2023-09-20 DIAGNOSIS — E782 Mixed hyperlipidemia: Secondary | ICD-10-CM | POA: Diagnosis not present

## 2023-09-20 DIAGNOSIS — Z1231 Encounter for screening mammogram for malignant neoplasm of breast: Secondary | ICD-10-CM | POA: Insufficient documentation

## 2023-09-20 MED ORDER — TRIAMCINOLONE ACETONIDE 0.1 % EX CREA: 1.0000 | TOPICAL_CREAM | Freq: Two times a day (BID) | CUTANEOUS | 0 refills | Status: DC

## 2023-09-20 MED ORDER — COVID-19 MRNA VAC-TRIS(PFIZER) 30 MCG/0.3ML IM SUSY: 0.3000 mL | PREFILLED_SYRINGE | Freq: Once | INTRAMUSCULAR | 0 refills | Status: AC

## 2023-09-20 NOTE — Progress Notes (Signed)
 Established Patient Office Visit  Subjective:  Patient ID: Leah Riley, female    DOB: 1955-11-28  Age: 68 y.o. MRN: [id number]  Chief Complaint  Patient presents with   Follow-up    1 week follow up    Patient is here today to follow up on completing her antibiotics for her left hand cellulitis/eczema. She reports her left hand is still tender. She reports her feet are still really dry but getting better. She states she has been putting moisturizer on hands and feet and using a glove and wrapping her feet in saran wrap. Encouraged patient to continue moisturizing and wearing glove on her hand and wrapping her feet daily. Will start triamcinolone  cream 0.1% twice daily to left hand for eczema.  Patients due for fasting blood work today. Her blood pressure is elevated but she reports not taking her medications yet as she is waiting to eat before taking them. She is past due for her mammogram; will reorder breast cancer screening to be completed. She states she got her flu shot last week at her pharmacy. Patient was encouraged to also get RSV and covid vaccines.  Lastly, patient states she is taking Chantix  only once daily and wearing nicotine step 1 patch daily. Encouraged patient to take Chantix  twice daily and once she has begun doing that to titrate down to the step 2 nicotine patch.    No other concerns at this time.   Past Medical History:  Diagnosis Date   COPD (chronic obstructive pulmonary disease) (HCC)    GERD (gastroesophageal reflux disease)    Headache    migraines   Hypertension     Past Surgical History:  Procedure Laterality Date   ABDOMINAL HYSTERECTOMY     APPENDECTOMY     BREAST CYST EXCISION Right    age 22   BREAST SURGERY Right    cyst removed   CYST EXCISION Left 07/28/2015   Procedure: CYST REMOVAL OF SUBCUTANEOUS LEFT KNEE;  Surgeon: Norleen JINNY Maltos, MD;  Location: [facility];  Service: Orthopedics;  Laterality: Left;   FOOT SURGERY Right    x3    KNEE ARTHROSCOPY Left    x2   OOPHORECTOMY     SHOULDER ARTHROSCOPY Right 05/26/2015   Procedure: SHOULDER RIGHT LIMITED ARTHROSCOPIC DEBRIDEMENT ARTHROSCOPIC SUBACROMIAL DECOMPRESSION MINI OPEN ROTATOR CUFF REPAIR MINI OPEN BICEPS TENODESIS;  Surgeon: Norleen JINNY Maltos, MD;  Location: [facility];  Service: Orthopedics;  Laterality: Right;   TUBAL LIGATION      Social History   Socioeconomic History   Marital status: Divorced    Spouse name: Not on file   Number of children: Not on file   Years of education: Not on file   Highest education level: Not on file  Occupational History   Not on file  Tobacco Use   Smoking status: Every Day    Current packs/day: 1.50    Average packs/day: 1.5 packs/day for 35.0 years (52.5 ttl pk-yrs)    Types: Cigarettes   Smokeless tobacco: Never   Tobacco comments:    0.5 pack a day   Substance and Sexual Activity   Alcohol use: Yes    Comment: occas - couple times per year   Drug use: Not on file   Sexual activity: Not on file  Other Topics Concern   Not on file  Social History Narrative   Not on file   Social Drivers of Health   Financial Resource Strain: Medium Risk (12/19/2022)  Overall Financial Resource Strain (CARDIA)    Difficulty of Paying Living Expenses: Somewhat hard  Food Insecurity: No Food Insecurity (12/19/2022)   Hunger Vital Sign    Worried About Running Out of Food in the Last Year: Never true    Ran Out of Food in the Last Year: Never true  Transportation Needs: No Transportation Needs (12/19/2022)   PRAPARE - Administrator, Civil Service (Medical): No    Lack of Transportation (Non-Medical): No  Physical Activity: Not on file  Stress: Stress Concern Present (12/19/2022)   [employer] of Occupational Health - Occupational Stress Questionnaire    Feeling of Stress : To some extent  Social Connections: Not on file  Intimate Partner Violence: Not At Risk (12/19/2022)   Humiliation, Afraid,  Rape, and Kick questionnaire    Fear of Current or Ex-Partner: No    Emotionally Abused: No    Physically Abused: No    Sexually Abused: No    Family History  Problem Relation Age of Onset   Coronary artery disease Mother    Coronary artery disease Father     Allergies  Allergen Reactions   Ace Inhibitors Cough   Gluten Meal Nausea And Vomiting    Itching, blistering   Sulfa Antibiotics Swelling    Rash, itching    Outpatient Medications Prior to Visit  Medication Sig   alendronate (FOSAMAX) 70 MG tablet Take 70 mg by mouth once a week.   amLODipine  (NORVASC ) 10 MG tablet Take 1 tablet (10 mg total) by mouth daily.   aspirin 81 MG tablet Take 81 mg by mouth daily.   atorvastatin  (LIPITOR) 20 MG tablet Take 1 tablet by mouth once daily   baclofen (LIORESAL) 10 MG tablet    buPROPion (WELLBUTRIN XL) 150 MG 24 hr tablet TAKE 1 TABLET BY MOUTH IN THE MORNING   Calcium  Carbonate-Vitamin D (CALTRATE 600+D PO) Take by mouth 2 (two) times daily.   doxycycline  (VIBRA -TABS) 100 MG tablet Take 1 tablet (100 mg total) by mouth 2 (two) times daily for 7 days.   estradiol (CLIMARA - DOSED IN MG/24 HR) 0.1 mg/24hr patch APPLY 1 PATCH TOPICALLY ONCE A WEEK   fluticasone  (FLONASE) 50 MCG/ACT nasal spray Place into both nostrils daily.   gabapentin (NEURONTIN) 300 MG capsule Take 1 capsule by mouth twice daily   meloxicam (MOBIC) 15 MG tablet Take 1 tablet by mouth once daily   mirtazapine (REMERON) 30 MG tablet Take 1 tablet by mouth once daily   montelukast  (SINGULAIR ) 10 MG tablet Take 1 tablet (10 mg total) by mouth daily.   multivitamin-iron-minerals-folic acid (CENTRUM) chewable tablet Chew 1 tablet by mouth daily.   omega-3 acid ethyl esters (LOVAZA) 1 g capsule Take by mouth.   Omega-3 Fatty Acids (FISH OIL) 1000 MG CAPS Fish Oil 1000 MG Oral Capsule QTY: 0 capsule Days: 0 Refills: 0  Written: 06/23/14 Patient Instructions:   pantoprazole  (PROTONIX ) 40 MG tablet Take 1 tablet (40 mg  total) by mouth daily.   solifenacin  (VESICARE ) 10 MG tablet Take 1 tablet by mouth once daily   TRELEGY ELLIPTA  100-62.5-25 MCG/ACT AEPB Inhale 1 puff into the lungs daily.   varenicline  (CHANTIX ) 0.5 MG tablet Take 1 tablet (0.5 mg total) by mouth 2 (two) times daily.   VENTOLIN HFA 108 (90 Base) MCG/ACT inhaler    VITAMIN E PO Take by mouth daily.   No facility-administered medications prior to visit.    Review of Systems  Constitutional: Negative.  Negative for chills, fever and malaise/fatigue.  HENT: Negative.  Negative for congestion and sore throat.   Eyes: Negative.  Negative for blurred vision and pain.  Respiratory: Negative.  Negative for cough and shortness of breath.   Cardiovascular: Negative.  Negative for chest pain, palpitations and leg swelling.  Gastrointestinal: Negative.  Negative for abdominal pain, blood in stool, constipation, diarrhea, heartburn, melena, nausea and vomiting.  Genitourinary: Negative.  Negative for dysuria, flank pain, frequency and urgency.  Musculoskeletal: Negative.  Negative for joint pain and myalgias.  Skin:  Positive for rash.  Neurological: Negative.  Negative for dizziness, tingling, sensory change, weakness and headaches.  Endo/Heme/Allergies: Negative.   Psychiatric/Behavioral: Negative.  Negative for depression and suicidal ideas. The patient is not nervous/anxious.        Objective:   BP (!) 164/84   Pulse 78   Ht 5' 2 (1.575 m)   Wt 120 lb (54.4 kg)   SpO2 95%   BMI 21.95 kg/m   Vitals:   09/20/23 0941  BP: (!) 164/84  Pulse: 78  Height: 5' 2 (1.575 m)  Weight: 120 lb (54.4 kg)  SpO2: 95%  BMI (Calculated): 21.94    Physical Exam Vitals and nursing note reviewed.  Constitutional:      Appearance: Normal appearance.  HENT:     Head: Normocephalic and atraumatic.     Nose: Nose normal.     Mouth/Throat:     Mouth: Mucous membranes are moist.     Pharynx: Oropharynx is clear.  Eyes:      Conjunctiva/sclera: Conjunctivae normal.     Pupils: Pupils are equal, round, and reactive to light.  Cardiovascular:     Rate and Rhythm: Normal rate and regular rhythm.     Pulses: Normal pulses.     Heart sounds: Normal heart sounds. No murmur heard. Pulmonary:     Effort: Pulmonary effort is normal.     Breath sounds: Normal breath sounds. No wheezing.  Abdominal:     General: Bowel sounds are normal.     Palpations: Abdomen is soft.     Tenderness: There is no abdominal tenderness. There is no right CVA tenderness or left CVA tenderness.  Musculoskeletal:        General: Normal range of motion.     Cervical back: Normal range of motion.     Right lower leg: No edema.     Left lower leg: No edema.  Feet:     Right foot:     Skin integrity: Dry skin (improving) and fissure present.  Skin:    General: Skin is warm and dry.     Findings: Rash present. Rash is scaling (left palm; improving.).  Neurological:     General: No focal deficit present.     Mental Status: She is alert and oriented to person, place, and time.  Psychiatric:        Mood and Affect: Mood normal.        Behavior: Behavior normal.      No results found for any visits on 09/20/23.  No results found for this or any previous visit (from the past 2160 hours).    Assessment & Plan:  Continue taking medications as prescribed. Start triamcinolone  cream 0.1% twice daily. Encouraged smoking cessation and titration down to step 2 nicotine patch. Reorder mammogram screening. Problem List Items Addressed This Visit     Nicotine dependence   Mixed hyperlipidemia   Essential hypertension, benign - Primary   Eczema  Relevant Medications   triamcinolone  cream (KENALOG ) 0.1 %   Breast cancer screening by mammogram   Relevant Orders   MM 3D SCREENING MAMMOGRAM BILATERAL BREAST   Need for COVID-19 vaccine   Relevant Medications   COVID-19 mRNA vaccine, Pfizer, (COMIRNATY) syringe    Return in about 3 weeks  (around 10/11/2023).   Total time spent:  FERNAND FREDY RAMAN, MD  09/20/2023   This document may have been prepared by [facility] Voice Recognition software and as such may include unintentional dictation errors.

## 2023-09-21 ENCOUNTER — Ambulatory Visit: Admitting: Internal Medicine

## 2023-09-21 ENCOUNTER — Ambulatory Visit: Payer: Self-pay | Admitting: Internal Medicine

## 2023-09-21 ENCOUNTER — Other Ambulatory Visit: Payer: Self-pay | Admitting: Internal Medicine

## 2023-09-21 DIAGNOSIS — D72828 Other elevated white blood cell count: Secondary | ICD-10-CM

## 2023-09-21 DIAGNOSIS — E871 Hypo-osmolality and hyponatremia: Secondary | ICD-10-CM

## 2023-09-21 DIAGNOSIS — Z79890 Hormone replacement therapy (postmenopausal): Secondary | ICD-10-CM

## 2023-09-21 LAB — CMP14+EGFR
ALT: 15 IU/L (ref 0–32)
AST: 28 IU/L (ref 0–40)
Albumin: 4.6 g/dL (ref 3.9–4.9)
Alkaline Phosphatase: 115 IU/L (ref 49–135)
BUN/Creatinine Ratio: 16 (ref 12–28)
BUN: 17 mg/dL (ref 8–27)
Bilirubin Total: 0.5 mg/dL (ref 0.0–1.2)
CO2: 20 mmol/L (ref 20–29)
Calcium: 9.6 mg/dL (ref 8.7–10.3)
Chloride: 92 mmol/L — ABNORMAL LOW (ref 96–106)
Creatinine, Ser: 1.06 mg/dL — ABNORMAL HIGH (ref 0.57–1.00)
Globulin, Total: 2.5 g/dL (ref 1.5–4.5)
Glucose: 94 mg/dL (ref 70–99)
Potassium: 5.3 mmol/L — ABNORMAL HIGH (ref 3.5–5.2)
Sodium: 128 mmol/L — ABNORMAL LOW (ref 134–144)
Total Protein: 7.1 g/dL (ref 6.0–8.5)
eGFR: 58 mL/min/1.73 — ABNORMAL LOW (ref >59)

## 2023-09-21 LAB — CBC WITH DIFFERENTIAL/PLATELET
Basophils Absolute: 0.1 x10E3/uL (ref 0.0–0.2)
Basos: 1 %
EOS (ABSOLUTE): 0.5 x10E3/uL — ABNORMAL HIGH (ref 0.0–0.4)
Eos: 4 %
Hematocrit: 37.8 % (ref 34.0–46.6)
Hemoglobin: 12.3 g/dL (ref 11.1–15.9)
Immature Grans (Abs): 0 x10E3/uL (ref 0.0–0.1)
Immature Granulocytes: 0 %
Lymphocytes Absolute: 2.7 x10E3/uL (ref 0.7–3.1)
Lymphs: 25 %
MCH: 32.5 pg (ref 26.6–33.0)
MCHC: 32.5 g/dL (ref 31.5–35.7)
MCV: 100 fL — ABNORMAL HIGH (ref 79–97)
Monocytes Absolute: 0.7 x10E3/uL (ref 0.1–0.9)
Monocytes: 6 %
Neutrophils Absolute: 7.1 x10E3/uL — ABNORMAL HIGH (ref 1.4–7.0)
Neutrophils: 64 %
Platelets: 310 x10E3/uL (ref 150–450)
RBC: 3.79 x10E6/uL (ref 3.77–5.28)
RDW: 13.2 % (ref 11.7–15.4)
WBC: 11 x10E3/uL — ABNORMAL HIGH (ref 3.4–10.8)

## 2023-09-21 LAB — LIPID PANEL W/O CHOL/HDL RATIO
Cholesterol, Total: 136 mg/dL (ref 100–199)
HDL: 54 mg/dL (ref >39)
LDL Chol Calc (NIH): 63 mg/dL (ref 0–99)
Triglycerides: 107 mg/dL (ref 0–149)
VLDL Cholesterol Cal: 19 mg/dL (ref 5–40)

## 2023-09-21 LAB — HEMOGLOBIN A1C
Est. average glucose Bld gHb Est-mCnc: 114 mg/dL
Hgb A1c MFr Bld: 5.6 % (ref 4.8–5.6)

## 2023-09-26 NOTE — Progress Notes (Signed)
Pt informed

## 2023-10-04 ENCOUNTER — Other Ambulatory Visit: Payer: Self-pay | Admitting: Internal Medicine

## 2023-10-11 ENCOUNTER — Ambulatory Visit (INDEPENDENT_AMBULATORY_CARE_PROVIDER_SITE_OTHER): Admitting: Internal Medicine

## 2023-10-11 ENCOUNTER — Encounter: Payer: Self-pay | Admitting: Internal Medicine

## 2023-10-11 VITALS — BP 150/78 | HR 76 | Ht 62.0 in | Wt 116.6 lb

## 2023-10-11 DIAGNOSIS — E782 Mixed hyperlipidemia: Secondary | ICD-10-CM | POA: Diagnosis not present

## 2023-10-11 DIAGNOSIS — F411 Generalized anxiety disorder: Secondary | ICD-10-CM | POA: Diagnosis not present

## 2023-10-11 DIAGNOSIS — I10 Essential (primary) hypertension: Secondary | ICD-10-CM | POA: Diagnosis not present

## 2023-10-11 DIAGNOSIS — E871 Hypo-osmolality and hyponatremia: Secondary | ICD-10-CM | POA: Insufficient documentation

## 2023-10-11 DIAGNOSIS — F17200 Nicotine dependence, unspecified, uncomplicated: Secondary | ICD-10-CM | POA: Diagnosis not present

## 2023-10-11 DIAGNOSIS — L309 Dermatitis, unspecified: Secondary | ICD-10-CM | POA: Diagnosis not present

## 2023-10-11 DIAGNOSIS — D72829 Elevated white blood cell count, unspecified: Secondary | ICD-10-CM | POA: Insufficient documentation

## 2023-10-11 DIAGNOSIS — D72828 Other elevated white blood cell count: Secondary | ICD-10-CM | POA: Diagnosis not present

## 2023-10-11 NOTE — Progress Notes (Signed)
 Established Patient Office Visit  Subjective:  Patient ID: Leah Riley, female    DOB: [birth year]  Age: 68 y.o. MRN: [id number]  Chief Complaint  Patient presents with   Follow-up    3 week follow up    Patient is here today and states her eczema is improving on her left palm. She has been using triamcinolone  cream as prescribed and using moisturizer daily. She repots not taking her blood pressure medication yet today and states that is why it is elevated. She also reports losing their housing last week and is now living in hotel. Otherwise she is doing okay and has no new complaints.      No other concerns at this time.   Past Medical History:  Diagnosis Date   COPD (chronic obstructive pulmonary disease) (HCC)    GERD (gastroesophageal reflux disease)    Headache    migraines   Hypertension     Past Surgical History:  Procedure Laterality Date   ABDOMINAL HYSTERECTOMY     APPENDECTOMY     BREAST CYST EXCISION Right    age 28   BREAST SURGERY Right    cyst removed   CYST EXCISION Left 07/28/2015   Procedure: CYST REMOVAL OF SUBCUTANEOUS LEFT KNEE;  Surgeon: Norleen JINNY Maltos, MD;  Location: [facility];  Service: Orthopedics;  Laterality: Left;   FOOT SURGERY Right    x3   KNEE ARTHROSCOPY Left    x2   OOPHORECTOMY     SHOULDER ARTHROSCOPY Right 05/26/2015   Procedure: SHOULDER RIGHT LIMITED ARTHROSCOPIC DEBRIDEMENT ARTHROSCOPIC SUBACROMIAL DECOMPRESSION MINI OPEN ROTATOR CUFF REPAIR MINI OPEN BICEPS TENODESIS;  Surgeon: Norleen JINNY Maltos, MD;  Location: [facility];  Service: Orthopedics;  Laterality: Right;   TUBAL LIGATION      Social History   Socioeconomic History   Marital status: Divorced    Spouse name: Not on file   Number of children: Not on file   Years of education: Not on file   Highest education level: Not on file  Occupational History   Not on file  Tobacco Use   Smoking status: Every Day    Current packs/day: 1.50    Average  packs/day: 1.5 packs/day for 35.0 years (52.5 ttl pk-yrs)    Types: Cigarettes   Smokeless tobacco: Never   Tobacco comments:    0.5 pack a day   Substance and Sexual Activity   Alcohol use: Yes    Comment: occas - couple times per year   Drug use: Not on file   Sexual activity: Not on file  Other Topics Concern   Not on file  Social History Narrative   Not on file   Social Drivers of Health   Financial Resource Strain: Medium Risk (12/19/2022)   Overall Financial Resource Strain (CARDIA)    Difficulty of Paying Living Expenses: Somewhat hard  Food Insecurity: No Food Insecurity (12/19/2022)   Hunger Vital Sign    Worried About Running Out of Food in the Last Year: Never true    Ran Out of Food in the Last Year: Never true  Transportation Needs: No Transportation Needs (12/19/2022)   PRAPARE - Administrator, Civil Service (Medical): No    Lack of Transportation (Non-Medical): No  Physical Activity: Not on file  Stress: Stress Concern Present (12/19/2022)   [employer] of Occupational Health - Occupational Stress Questionnaire    Feeling of Stress : To some extent  Social Connections: Not on file  Intimate Partner Violence: Not At Risk (12/19/2022)   Humiliation, Afraid, Rape, and Kick questionnaire    Fear of Current or Ex-Partner: No    Emotionally Abused: No    Physically Abused: No    Sexually Abused: No    Family History  Problem Relation Age of Onset   Coronary artery disease Mother    Coronary artery disease Father     Allergies  Allergen Reactions   Ace Inhibitors Cough   Gluten Meal Nausea And Vomiting    Itching, blistering   Sulfa Antibiotics Swelling    Rash, itching    Outpatient Medications Prior to Visit  Medication Sig   alendronate (FOSAMAX) 70 MG tablet Take 70 mg by mouth once a week.   amLODipine  (NORVASC ) 10 MG tablet Take 1 tablet (10 mg total) by mouth daily.   aspirin 81 MG tablet Take 81 mg by mouth daily.    atorvastatin  (LIPITOR) 20 MG tablet Take 1 tablet by mouth once daily   baclofen (LIORESAL) 10 MG tablet    buPROPion (WELLBUTRIN XL) 150 MG 24 hr tablet TAKE 1 TABLET BY MOUTH IN THE MORNING   Calcium  Carbonate-Vitamin D (CALTRATE 600+D PO) Take by mouth 2 (two) times daily.   estradiol (CLIMARA - DOSED IN MG/24 HR) 0.1 mg/24hr patch APPLY 1 PATCH TOPICALLY ONCE A WEEK   fluticasone  (FLONASE) 50 MCG/ACT nasal spray Place into both nostrils daily.   gabapentin (NEURONTIN) 300 MG capsule Take 1 capsule by mouth twice daily   meloxicam (MOBIC) 15 MG tablet Take 1 tablet by mouth once daily   mirtazapine (REMERON) 30 MG tablet Take 1 tablet by mouth once daily   montelukast  (SINGULAIR ) 10 MG tablet Take 1 tablet (10 mg total) by mouth daily.   multivitamin-iron-minerals-folic acid (CENTRUM) chewable tablet Chew 1 tablet by mouth daily.   omega-3 acid ethyl esters (LOVAZA) 1 g capsule Take by mouth.   Omega-3 Fatty Acids (FISH OIL) 1000 MG CAPS Fish Oil 1000 MG Oral Capsule QTY: 0 capsule Days: 0 Refills: 0  Written: 06/23/14 Patient Instructions:   pantoprazole  (PROTONIX ) 40 MG tablet Take 1 tablet (40 mg total) by mouth daily.   solifenacin  (VESICARE ) 10 MG tablet Take 1 tablet by mouth once daily   TRELEGY ELLIPTA  100-62.5-25 MCG/ACT AEPB Inhale 1 puff into the lungs daily.   triamcinolone  cream (KENALOG ) 0.1 % Apply 1 Application topically 2 (two) times daily.   varenicline  (CHANTIX ) 0.5 MG tablet Take 1 tablet (0.5 mg total) by mouth 2 (two) times daily.   VENTOLIN HFA 108 (90 Base) MCG/ACT inhaler    VITAMIN E PO Take by mouth daily.   No facility-administered medications prior to visit.    Review of Systems  Constitutional: Negative.  Negative for chills, fever and malaise/fatigue.  HENT: Negative.  Negative for congestion and sore throat.   Eyes: Negative.  Negative for blurred vision and pain.  Respiratory: Negative.  Negative for cough and shortness of breath.   Cardiovascular:  Negative.  Negative for chest pain, palpitations and leg swelling.  Gastrointestinal: Negative.  Negative for abdominal pain, blood in stool, constipation, diarrhea, heartburn, melena, nausea and vomiting.  Genitourinary: Negative.  Negative for dysuria, flank pain, frequency and urgency.  Musculoskeletal: Negative.  Negative for joint pain and myalgias.  Skin:  Positive for rash (eczema on left palm).  Neurological: Negative.  Negative for dizziness, tingling, sensory change, weakness and headaches.  Endo/Heme/Allergies: Negative.   Psychiatric/Behavioral: Negative.  Negative for depression and suicidal ideas. The  patient is not nervous/anxious.        Objective:   BP (!) 150/78   Pulse 76   Ht 5' 2 (1.575 m)   Wt 116 lb 9.6 oz (52.9 kg)   SpO2 96%   BMI 21.33 kg/m   Vitals:   10/11/23 0923  BP: (!) 150/78  Pulse: 76  Height: 5' 2 (1.575 m)  Weight: 116 lb 9.6 oz (52.9 kg)  SpO2: 96%  BMI (Calculated): 21.32    Physical Exam Vitals and nursing note reviewed.  Constitutional:      Appearance: Normal appearance.  HENT:     Head: Normocephalic and atraumatic.     Nose: Nose normal.     Mouth/Throat:     Mouth: Mucous membranes are moist.     Pharynx: Oropharynx is clear.  Eyes:     Conjunctiva/sclera: Conjunctivae normal.     Pupils: Pupils are equal, round, and reactive to light.  Cardiovascular:     Rate and Rhythm: Normal rate and regular rhythm.     Pulses: Normal pulses.     Heart sounds: Normal heart sounds. No murmur heard. Pulmonary:     Effort: Pulmonary effort is normal.     Breath sounds: Normal breath sounds. No wheezing.  Abdominal:     General: Bowel sounds are normal.     Palpations: Abdomen is soft.     Tenderness: There is no abdominal tenderness. There is no right CVA tenderness or left CVA tenderness.  Musculoskeletal:        General: Normal range of motion.     Cervical back: Normal range of motion.     Right lower leg: No edema.      Left lower leg: No edema.  Skin:    General: Skin is warm and dry.     Findings: Rash (improving eczema on left palm.) present.  Neurological:     General: No focal deficit present.     Mental Status: She is alert and oriented to person, place, and time.  Psychiatric:        Mood and Affect: Mood normal.        Behavior: Behavior normal.      No results found for any visits on 10/11/23.  Recent Results (from the past 2160 hours)  Hemoglobin A1c     Status: None   Collection Time: 09/20/23 10:16 AM  Result Value Ref Range   Hgb A1c MFr Bld 5.6 4.8 - 5.6 %    Comment:          Prediabetes: 5.7 - 6.4          Diabetes: >6.4          Glycemic control for adults with diabetes: <7.0    Est. average glucose Bld gHb Est-mCnc 114 mg/dL  Lipid Panel w/o Chol/HDL Ratio     Status: None   Collection Time: 09/20/23 10:16 AM  Result Value Ref Range   Cholesterol, Total 136 100 - 199 mg/dL   Triglycerides 892 0 - 149 mg/dL   HDL 54 >60 mg/dL   VLDL Cholesterol Cal 19 5 - 40 mg/dL   LDL Chol Calc (NIH) 63 0 - 99 mg/dL  [id number]+ZHQM     Status: Abnormal   Collection Time: 09/20/23 10:16 AM  Result Value Ref Range   Glucose 94 70 - 99 mg/dL   BUN 17 8 - 27 mg/dL   Creatinine, Ser 8.93 (H) 0.57 - 1.00 mg/dL   eGFR 58 (L) >40  mL/min/1.73   BUN/Creatinine Ratio 16 12 - 28   Sodium 128 (L) 134 - 144 mmol/L   Potassium 5.3 (H) 3.5 - 5.2 mmol/L   Chloride 92 (L) 96 - 106 mmol/L   CO2 20 20 - 29 mmol/L   Calcium  9.6 8.7 - 10.3 mg/dL   Total Protein 7.1 6.0 - 8.5 g/dL   Albumin 4.6 3.9 - 4.9 g/dL   Globulin, Total 2.5 1.5 - 4.5 g/dL   Bilirubin Total 0.5 0.0 - 1.2 mg/dL   Alkaline Phosphatase 115 49 - 135 IU/L    Comment:               **Please note reference interval change**   AST 28 0 - 40 IU/L   ALT 15 0 - 32 IU/L  CBC with Diff     Status: Abnormal   Collection Time: 09/20/23 10:16 AM  Result Value Ref Range   WBC 11.0 (H) 3.4 - 10.8 x10E3/uL   RBC 3.79 3.77 - 5.28 x10E6/uL    Hemoglobin 12.3 11.1 - 15.9 g/dL   Hematocrit 62.1 65.9 - 46.6 %   MCV 100 (H) 79 - 97 fL   MCH 32.5 26.6 - 33.0 pg   MCHC 32.5 31.5 - 35.7 g/dL   RDW 86.7 88.2 - 84.5 %   Platelets 310 150 - 450 x10E3/uL   Neutrophils 64 Not Estab. %   Lymphs 25 Not Estab. %   Monocytes 6 Not Estab. %   Eos 4 Not Estab. %   Basos 1 Not Estab. %   Neutrophils Absolute 7.1 (H) 1.4 - 7.0 x10E3/uL   Lymphocytes Absolute 2.7 0.7 - 3.1 x10E3/uL   Monocytes Absolute 0.7 0.1 - 0.9 x10E3/uL   EOS (ABSOLUTE) 0.5 (H) 0.0 - 0.4 x10E3/uL   Basophils Absolute 0.1 0.0 - 0.2 x10E3/uL   Immature Granulocytes 0 Not Estab. %   Immature Grans (Abs) 0.0 0.0 - 0.1 x10E3/uL      Assessment & Plan:  Recheck CBC and CMP today to monitor sodium, potassium, WBCs. Continue medications as prescribed. Reinforced healthy diet and exercise. Problem List Items Addressed This Visit     Nicotine dependence   Mixed hyperlipidemia   Essential hypertension, benign - Primary   GAD (generalized anxiety disorder)   Eczema   Leucocytosis   Relevant Orders   CBC with Diff   Hyponatremia   Relevant Orders   CMP14+EGFR    Return in about 6 weeks (around 11/22/2023).   Total time spent: 25 minutes  FERNAND FREDY RAMAN, MD  10/11/2023   This document may have been prepared by [facility] Voice Recognition software and as such may include unintentional dictation errors.

## 2023-10-12 ENCOUNTER — Ambulatory Visit: Payer: Self-pay | Admitting: Internal Medicine

## 2023-10-12 DIAGNOSIS — E875 Hyperkalemia: Secondary | ICD-10-CM

## 2023-10-12 DIAGNOSIS — N183 Chronic kidney disease, stage 3 unspecified: Secondary | ICD-10-CM

## 2023-10-12 LAB — CBC WITH DIFFERENTIAL/PLATELET
Basophils Absolute: 0.1 x10E3/uL (ref 0.0–0.2)
Basos: 1 %
EOS (ABSOLUTE): 0.4 x10E3/uL (ref 0.0–0.4)
Eos: 5 %
Hematocrit: 37.1 % (ref 34.0–46.6)
Hemoglobin: 12 g/dL (ref 11.1–15.9)
Immature Grans (Abs): 0 x10E3/uL (ref 0.0–0.1)
Immature Granulocytes: 0 %
Lymphocytes Absolute: 2.6 x10E3/uL (ref 0.7–3.1)
Lymphs: 34 %
MCH: 31.8 pg (ref 26.6–33.0)
MCHC: 32.3 g/dL (ref 31.5–35.7)
MCV: 98 fL — ABNORMAL HIGH (ref 79–97)
Monocytes Absolute: 0.6 x10E3/uL (ref 0.1–0.9)
Monocytes: 8 %
Neutrophils Absolute: 4 x10E3/uL (ref 1.4–7.0)
Neutrophils: 52 %
Platelets: 321 x10E3/uL (ref 150–450)
RBC: 3.77 x10E6/uL (ref 3.77–5.28)
RDW: 12.9 % (ref 11.7–15.4)
WBC: 7.7 x10E3/uL (ref 3.4–10.8)

## 2023-10-12 LAB — CMP14+EGFR
ALT: 23 IU/L (ref 0–32)
AST: 30 IU/L (ref 0–40)
Albumin: 4.4 g/dL (ref 3.9–4.9)
Alkaline Phosphatase: 114 IU/L (ref 49–135)
BUN/Creatinine Ratio: 11 — ABNORMAL LOW (ref 12–28)
BUN: 13 mg/dL (ref 8–27)
Bilirubin Total: 0.6 mg/dL (ref 0.0–1.2)
CO2: 22 mmol/L (ref 20–29)
Calcium: 10.1 mg/dL (ref 8.7–10.3)
Chloride: 100 mmol/L (ref 96–106)
Creatinine, Ser: 1.17 mg/dL — ABNORMAL HIGH (ref 0.57–1.00)
Globulin, Total: 2.5 g/dL (ref 1.5–4.5)
Glucose: 102 mg/dL — ABNORMAL HIGH (ref 70–99)
Potassium: 5.5 mmol/L — ABNORMAL HIGH (ref 3.5–5.2)
Sodium: 137 mmol/L (ref 134–144)
Total Protein: 6.9 g/dL (ref 6.0–8.5)
eGFR: 51 mL/min/1.73 — ABNORMAL LOW (ref >59)

## 2023-10-15 ENCOUNTER — Ambulatory Visit
Admission: RE | Admit: 2023-10-15 | Discharge: 2023-10-15 | Disposition: A | Discharge status: Home / Self Care | Source: Ambulatory Visit | Attending: Internal Medicine | Admitting: Internal Medicine

## 2023-10-15 DIAGNOSIS — Z1231 Encounter for screening mammogram for malignant neoplasm of breast: Secondary | ICD-10-CM | POA: Insufficient documentation

## 2023-10-17 NOTE — Progress Notes (Signed)
 Patient notified

## 2023-10-20 ENCOUNTER — Other Ambulatory Visit: Payer: Self-pay | Admitting: Internal Medicine

## 2023-10-20 DIAGNOSIS — L309 Dermatitis, unspecified: Secondary | ICD-10-CM

## 2023-11-17 ENCOUNTER — Other Ambulatory Visit: Payer: Self-pay | Admitting: Internal Medicine

## 2023-11-22 ENCOUNTER — Encounter: Payer: Self-pay | Admitting: Internal Medicine

## 2023-11-22 ENCOUNTER — Ambulatory Visit
Admission: RE | Admit: 2023-11-22 | Discharge: 2023-11-22 | Disposition: A | Discharge status: Home / Self Care | Attending: Internal Medicine | Admitting: Internal Medicine

## 2023-11-22 ENCOUNTER — Ambulatory Visit: Payer: Self-pay | Admitting: Internal Medicine

## 2023-11-22 ENCOUNTER — Ambulatory Visit (INDEPENDENT_AMBULATORY_CARE_PROVIDER_SITE_OTHER): Admitting: Internal Medicine

## 2023-11-22 ENCOUNTER — Ambulatory Visit
Admission: RE | Admit: 2023-11-22 | Discharge: 2023-11-22 | Disposition: A | Discharge status: Home / Self Care | Source: Ambulatory Visit | Attending: Internal Medicine | Admitting: Internal Medicine

## 2023-11-22 VITALS — BP 142/88 | HR 87 | Ht 62.0 in | Wt 107.0 lb

## 2023-11-22 DIAGNOSIS — E782 Mixed hyperlipidemia: Secondary | ICD-10-CM | POA: Diagnosis not present

## 2023-11-22 DIAGNOSIS — J301 Allergic rhinitis due to pollen: Secondary | ICD-10-CM

## 2023-11-22 DIAGNOSIS — I10 Essential (primary) hypertension: Secondary | ICD-10-CM | POA: Diagnosis not present

## 2023-11-22 DIAGNOSIS — F17210 Nicotine dependence, cigarettes, uncomplicated: Secondary | ICD-10-CM

## 2023-11-22 DIAGNOSIS — J441 Chronic obstructive pulmonary disease with (acute) exacerbation: Secondary | ICD-10-CM | POA: Diagnosis not present

## 2023-11-22 DIAGNOSIS — E875 Hyperkalemia: Secondary | ICD-10-CM | POA: Diagnosis not present

## 2023-11-22 DIAGNOSIS — J449 Chronic obstructive pulmonary disease, unspecified: Secondary | ICD-10-CM | POA: Diagnosis not present

## 2023-11-22 DIAGNOSIS — R059 Cough, unspecified: Secondary | ICD-10-CM | POA: Diagnosis not present

## 2023-11-22 DIAGNOSIS — R918 Other nonspecific abnormal finding of lung field: Secondary | ICD-10-CM | POA: Diagnosis not present

## 2023-11-22 MED ORDER — AMOXICILLIN-POT CLAVULANATE 875-125 MG PO TABS: 1.0000 | ORAL_TABLET | Freq: Two times a day (BID) | ORAL | 0 refills | Status: DC

## 2023-11-22 MED ORDER — PREDNISONE 20 MG PO TABS: 40.0000 mg | ORAL_TABLET | Freq: Every day | ORAL | 0 refills | Status: DC

## 2023-11-22 NOTE — Progress Notes (Signed)
 Established Patient Office Visit  Subjective:  Patient ID: Leah Riley, female    DOB: Jan 11, 1955  Age: 68 y.o. MRN: [id number]  Chief Complaint  Patient presents with   Follow-up    6 week follow up    Patient is here today for follow up. She reports she has been feeling poorly since her last visit.  She reports she has not been taking her medications because she has not been able to eat since getting her bottom teeth pulled. Her BP is high-She states she has appointment later today to see the dentist about her pain. She has her dentures in today and her gums do not have signs of infection and there is no skin breakdown. She has gabapentin and Meloxicam and states that has not helped the pain. Recommend ensure or boost with medications if she can't tolerate soft foods.  She reports cough that is producing sputum and worsening shortness of breath. Denies any fever or chills. Her oxygen saturation is 91% today on room air. Will order chest xray today. Start Augmentin  and prednisone  burst for COPD exacerbation.  She also endorses bilateral leg cramps. Recommend to soak feet in warm water for 10 minutes each night before bed. Her last 2 Potassium levels were above normal- she was referred to renal- but says she did not geta call from them. Will repeat BMP today.     No other concerns at this time.   Past Medical History:  Diagnosis Date   COPD (chronic obstructive pulmonary disease) (HCC)    GERD (gastroesophageal reflux disease)    Headache    migraines   Hypertension     Past Surgical History:  Procedure Laterality Date   ABDOMINAL HYSTERECTOMY     APPENDECTOMY     BREAST CYST EXCISION Right    age 9   BREAST SURGERY Right    cyst removed   CYST EXCISION Left 07/28/2015   Procedure: CYST REMOVAL OF SUBCUTANEOUS LEFT KNEE;  Surgeon: Norleen JINNY Maltos, MD;  Location: [facility];  Service: Orthopedics;  Laterality: Left;   FOOT SURGERY Right    x3   KNEE ARTHROSCOPY  Left    x2   OOPHORECTOMY     SHOULDER ARTHROSCOPY Right 05/26/2015   Procedure: SHOULDER RIGHT LIMITED ARTHROSCOPIC DEBRIDEMENT ARTHROSCOPIC SUBACROMIAL DECOMPRESSION MINI OPEN ROTATOR CUFF REPAIR MINI OPEN BICEPS TENODESIS;  Surgeon: Norleen JINNY Maltos, MD;  Location: [facility];  Service: Orthopedics;  Laterality: Right;   TUBAL LIGATION      Social History   Socioeconomic History   Marital status: Divorced    Spouse name: Not on file   Number of children: Not on file   Years of education: Not on file   Highest education level: Not on file  Occupational History   Not on file  Tobacco Use   Smoking status: Every Day    Current packs/day: 1.50    Average packs/day: 1.5 packs/day for 35.0 years (52.5 ttl pk-yrs)    Types: Cigarettes   Smokeless tobacco: Never   Tobacco comments:    0.5 pack a day   Substance and Sexual Activity   Alcohol use: Yes    Comment: occas - couple times per year   Drug use: Not on file   Sexual activity: Not on file  Other Topics Concern   Not on file  Social History Narrative   Not on file   Social Drivers of Health   Financial Resource Strain: Medium Risk (12/19/2022)  Overall Financial Resource Strain (CARDIA)    Difficulty of Paying Living Expenses: Somewhat hard  Food Insecurity: No Food Insecurity (12/19/2022)   Hunger Vital Sign    Worried About Running Out of Food in the Last Year: Never true    Ran Out of Food in the Last Year: Never true  Transportation Needs: No Transportation Needs (12/19/2022)   PRAPARE - Administrator, Civil Service (Medical): No    Lack of Transportation (Non-Medical): No  Physical Activity: Not on file  Stress: Stress Concern Present (12/19/2022)   [employer] of Occupational [employer] - Occupational Stress Questionnaire    Feeling of Stress : To some extent  Social Connections: Not on file  Intimate Partner Violence: Not At Risk (12/19/2022)   Humiliation, Afraid, Rape, and Kick  questionnaire    Fear of Current or Ex-Partner: No    Emotionally Abused: No    Physically Abused: No    Sexually Abused: No    Family History  Problem Relation Age of Onset   Coronary artery disease Mother    Coronary artery disease Father     Allergies  Allergen Reactions   Ace Inhibitors Cough   Gluten Meal Nausea And Vomiting    Itching, blistering   Sulfa Antibiotics Swelling    Rash, itching    Outpatient Medications Prior to Visit  Medication Sig   alendronate (FOSAMAX) 70 MG tablet Take 70 mg by mouth once a week.   amLODipine  (NORVASC ) 10 MG tablet Take 1 tablet (10 mg total) by mouth daily.   aspirin 81 MG tablet Take 81 mg by mouth daily.   atorvastatin  (LIPITOR) 20 MG tablet Take 1 tablet by mouth once daily   baclofen (LIORESAL) 10 MG tablet    buPROPion (WELLBUTRIN XL) 150 MG 24 hr tablet TAKE 1 TABLET BY MOUTH IN THE MORNING   Calcium  Carbonate-Vitamin D (CALTRATE 600+D PO) Take by mouth 2 (two) times daily.   estradiol (CLIMARA - DOSED IN MG/24 HR) 0.1 mg/24hr patch APPLY 1 PATCH TOPICALLY ONCE A WEEK   fluticasone  (FLONASE) 50 MCG/ACT nasal spray Place into both nostrils daily.   gabapentin (NEURONTIN) 300 MG capsule Take 1 capsule by mouth twice daily   meloxicam (MOBIC) 15 MG tablet Take 1 tablet by mouth once daily   mirtazapine (REMERON) 30 MG tablet Take 1 tablet by mouth once daily   montelukast  (SINGULAIR ) 10 MG tablet Take 1 tablet (10 mg total) by mouth daily.   multivitamin-iron-minerals-folic acid (CENTRUM) chewable tablet Chew 1 tablet by mouth daily.   omega-3 acid ethyl esters (LOVAZA) 1 g capsule Take by mouth.   Omega-3 Fatty Acids (FISH OIL) 1000 MG CAPS Fish Oil 1000 MG Oral Capsule QTY: 0 capsule Days: 0 Refills: 0  Written: 06/23/14 Patient Instructions:   pantoprazole  (PROTONIX ) 40 MG tablet Take 1 tablet by mouth once daily   solifenacin  (VESICARE ) 10 MG tablet Take 1 tablet by mouth once daily   TRELEGY ELLIPTA  100-62.5-25 MCG/ACT AEPB  Inhale 1 puff into the lungs daily.   triamcinolone  cream (KENALOG ) 0.1 % Apply topically 2 (two) times daily. Use until eczema resolves then discontinue use.   varenicline  (CHANTIX ) 0.5 MG tablet Take 1 tablet (0.5 mg total) by mouth 2 (two) times daily.   VENTOLIN HFA 108 (90 Base) MCG/ACT inhaler    VITAMIN E PO Take by mouth daily.   No facility-administered medications prior to visit.    Review of Systems  Constitutional: Negative.  Negative for chills,  fever and malaise/fatigue.  HENT:  Positive for congestion. Negative for sore throat.        Oral pain  Eyes: Negative.  Negative for blurred vision and pain.  Respiratory:  Positive for cough and sputum production. Negative for shortness of breath.   Cardiovascular: Negative.  Negative for chest pain, palpitations and leg swelling.  Gastrointestinal: Negative.  Negative for abdominal pain, blood in stool, constipation, diarrhea, heartburn, melena, nausea and vomiting.  Genitourinary: Negative.  Negative for dysuria, flank pain, frequency and urgency.  Musculoskeletal: Negative.  Negative for joint pain and myalgias.  Skin: Negative.   Neurological: Negative.  Negative for dizziness, tingling, sensory change, weakness and headaches.  Endo/Heme/Allergies: Negative.   Psychiatric/Behavioral: Negative.  Negative for depression and suicidal ideas. The patient is not nervous/anxious.        Objective:   BP (!) 142/88   Pulse 87   Ht 5' 2 (1.575 m)   Wt 107 lb (48.5 kg)   SpO2 91%   BMI 19.57 kg/m   Vitals:   11/22/23 0902  BP: (!) 142/88  Pulse: 87  Height: 5' 2 (1.575 m)  Weight: 107 lb (48.5 kg)  SpO2: 91%  BMI (Calculated): 19.57    Physical Exam Vitals and nursing note reviewed.  Constitutional:      Appearance: Normal appearance.  HENT:     Head: Normocephalic and atraumatic.     Nose: Nose normal.     Mouth/Throat:     Mouth: Mucous membranes are moist.     Pharynx: Oropharynx is clear.  Eyes:      Conjunctiva/sclera: Conjunctivae normal.     Pupils: Pupils are equal, round, and reactive to light.  Cardiovascular:     Rate and Rhythm: Normal rate and regular rhythm.     Pulses: Normal pulses.     Heart sounds: Normal heart sounds. No murmur heard. Pulmonary:     Effort: Pulmonary effort is normal.     Breath sounds: Decreased breath sounds present. No wheezing.  Abdominal:     General: Bowel sounds are normal.     Palpations: Abdomen is soft.     Tenderness: There is no abdominal tenderness. There is no right CVA tenderness or left CVA tenderness.  Musculoskeletal:        General: Normal range of motion.     Cervical back: Normal range of motion.     Right lower leg: No edema.     Left lower leg: No edema.  Skin:    General: Skin is warm and dry.  Neurological:     General: No focal deficit present.     Mental Status: She is alert and oriented to person, place, and time.  Psychiatric:        Mood and Affect: Mood normal.        Behavior: Behavior normal.      No results found for any visits on 11/22/23.  Recent Results (from the past 2160 hours)  Hemoglobin A1c     Status: None   Collection Time: 09/20/23 10:16 AM  Result Value Ref Range   Hgb A1c MFr Bld 5.6 4.8 - 5.6 %    Comment:          Prediabetes: 5.7 - 6.4          Diabetes: >6.4          Glycemic control for adults with diabetes: <7.0    Est. average glucose Bld gHb Est-mCnc 114 mg/dL  Lipid Panel w/o  Chol/HDL Ratio     Status: None   Collection Time: 09/20/23 10:16 AM  Result Value Ref Range   Cholesterol, Total 136 100 - 199 mg/dL   Triglycerides 892 0 - 149 mg/dL   HDL 54 >60 mg/dL   VLDL Cholesterol Cal 19 5 - 40 mg/dL   LDL Chol Calc (NIH) 63 0 - 99 mg/dL  [id number]     Status: Abnormal   Collection Time: 09/20/23 10:16 AM  Result Value Ref Range   Glucose 94 70 - 99 mg/dL   BUN 17 8 - 27 mg/dL   Creatinine, Ser 8.93 (H) 0.57 - 1.00 mg/dL   eGFR 58 (L) >40 fO/fpw/8.26   BUN/Creatinine  Ratio 16 12 - 28   Sodium 128 (L) 134 - 144 mmol/L   Potassium 5.3 (H) 3.5 - 5.2 mmol/L   Chloride 92 (L) 96 - 106 mmol/L   CO2 20 20 - 29 mmol/L   Calcium  9.6 8.7 - 10.3 mg/dL   Total Protein 7.1 6.0 - 8.5 g/dL   Albumin 4.6 3.9 - 4.9 g/dL   Globulin, Total 2.5 1.5 - 4.5 g/dL   Bilirubin Total 0.5 0.0 - 1.2 mg/dL   Alkaline Phosphatase 115 49 - 135 IU/L    Comment:               **Please note reference interval change**   AST 28 0 - 40 IU/L   ALT 15 0 - 32 IU/L  CBC with Diff     Status: Abnormal   Collection Time: 09/20/23 10:16 AM  Result Value Ref Range   WBC 11.0 (H) 3.4 - 10.8 x10E3/uL   RBC 3.79 3.77 - 5.28 x10E6/uL   Hemoglobin 12.3 11.1 - 15.9 g/dL   Hematocrit 62.1 65.9 - 46.6 %   MCV 100 (H) 79 - 97 fL   MCH 32.5 26.6 - 33.0 pg   MCHC 32.5 31.5 - 35.7 g/dL   RDW 86.7 88.2 - 84.5 %   Platelets 310 150 - 450 x10E3/uL   Neutrophils 64 Not Estab. %   Lymphs 25 Not Estab. %   Monocytes 6 Not Estab. %   Eos 4 Not Estab. %   Basos 1 Not Estab. %   Neutrophils Absolute 7.1 (H) 1.4 - 7.0 x10E3/uL   Lymphocytes Absolute 2.7 0.7 - 3.1 x10E3/uL   Monocytes Absolute 0.7 0.1 - 0.9 x10E3/uL   EOS (ABSOLUTE) 0.5 (H) 0.0 - 0.4 x10E3/uL   Basophils Absolute 0.1 0.0 - 0.2 x10E3/uL   Immature Granulocytes 0 Not Estab. %   Immature Grans (Abs) 0.0 0.0 - 0.1 x10E3/uL  CBC with Diff     Status: Abnormal   Collection Time: 10/11/23  9:45 AM  Result Value Ref Range   WBC 7.7 3.4 - 10.8 x10E3/uL   RBC 3.77 3.77 - 5.28 x10E6/uL   Hemoglobin 12.0 11.1 - 15.9 g/dL   Hematocrit 62.8 65.9 - 46.6 %   MCV 98 (H) 79 - 97 fL   MCH 31.8 26.6 - 33.0 pg   MCHC 32.3 31.5 - 35.7 g/dL   RDW 87.0 88.2 - 84.5 %   Platelets 321 150 - 450 x10E3/uL   Neutrophils 52 Not Estab. %   Lymphs 34 Not Estab. %   Monocytes 8 Not Estab. %   Eos 5 Not Estab. %   Basos 1 Not Estab. %   Neutrophils Absolute 4.0 1.4 - 7.0 x10E3/uL   Lymphocytes Absolute 2.6 0.7 - 3.1 x10E3/uL   Monocytes  Absolute 0.6 0.1 -  0.9 x10E3/uL   EOS (ABSOLUTE) 0.4 0.0 - 0.4 x10E3/uL   Basophils Absolute 0.1 0.0 - 0.2 x10E3/uL   Immature Granulocytes 0 Not Estab. %   Immature Grans (Abs) 0.0 0.0 - 0.1 x10E3/uL  CMP14+EGFR     Status: Abnormal   Collection Time: 10/11/23  9:45 AM  Result Value Ref Range   Glucose 102 (H) 70 - 99 mg/dL   BUN 13 8 - 27 mg/dL   Creatinine, Ser 8.82 (H) 0.57 - 1.00 mg/dL   eGFR 51 (L) >40 fO/fpw/8.26   BUN/Creatinine Ratio 11 (L) 12 - 28   Sodium 137 134 - 144 mmol/L   Potassium 5.5 (H) 3.5 - 5.2 mmol/L   Chloride 100 96 - 106 mmol/L   CO2 22 20 - 29 mmol/L   Calcium  10.1 8.7 - 10.3 mg/dL   Total Protein 6.9 6.0 - 8.5 g/dL   Albumin 4.4 3.9 - 4.9 g/dL   Globulin, Total 2.5 1.5 - 4.5 g/dL   Bilirubin Total 0.6 0.0 - 1.2 mg/dL   Alkaline Phosphatase 114 49 - 135 IU/L   AST 30 0 - 40 IU/L   ALT 23 0 - 32 IU/L      Assessment & Plan:  Reinforced medication compliance. Continue taking medications as prescribed. Recommend soft diet and ensure to take with medications to reduce nausea. Will order chest xray. Start Augmentin  and prednisone  burst. Check BMP today for hyperkalemia. Problem List Items Addressed This Visit     Nicotine dependence   Mixed hyperlipidemia   Essential hypertension, benign   Seasonal allergic rhinitis due to pollen   COPD with acute exacerbation (HCC) - Primary   Relevant Medications   amoxicillin -clavulanate (AUGMENTIN ) 875-125 MG tablet   predniSONE  (DELTASONE ) 20 MG tablet   Other Relevant Orders   DG Chest 2 View (Completed)   Hyperkalemia   Relevant Orders   Basic metabolic panel with GFR    Return in about 1 week (around 11/29/2023).   Total time spent: 25 minutes. This time includes review of previous notes and results and patient face to face interaction during today's visit.    Leah FREDY RAMAN, MD  11/22/2023   This document may have been prepared by [facility] Voice Recognition software and as such may include unintentional dictation  errors.

## 2023-11-22 NOTE — Progress Notes (Signed)
 Patient notified

## 2023-12-07 ENCOUNTER — Ambulatory Visit: Admitting: Internal Medicine

## 2023-12-12 ENCOUNTER — Other Ambulatory Visit: Payer: Self-pay | Admitting: Internal Medicine

## 2023-12-12 DIAGNOSIS — Z79890 Hormone replacement therapy (postmenopausal): Secondary | ICD-10-CM

## 2023-12-13 ENCOUNTER — Encounter: Payer: Self-pay | Admitting: Internal Medicine

## 2023-12-13 ENCOUNTER — Ambulatory Visit: Admitting: Internal Medicine

## 2023-12-13 VITALS — BP 154/88 | HR 77 | Ht 62.0 in | Wt 112.4 lb

## 2023-12-13 DIAGNOSIS — J301 Allergic rhinitis due to pollen: Secondary | ICD-10-CM | POA: Diagnosis not present

## 2023-12-13 DIAGNOSIS — J441 Chronic obstructive pulmonary disease with (acute) exacerbation: Secondary | ICD-10-CM | POA: Diagnosis not present

## 2023-12-13 DIAGNOSIS — E782 Mixed hyperlipidemia: Secondary | ICD-10-CM | POA: Diagnosis not present

## 2023-12-13 DIAGNOSIS — N1831 Chronic kidney disease, stage 3a: Secondary | ICD-10-CM | POA: Diagnosis not present

## 2023-12-13 DIAGNOSIS — I10 Essential (primary) hypertension: Secondary | ICD-10-CM | POA: Diagnosis not present

## 2023-12-13 MED ORDER — FLUTICASONE PROPIONATE 50 MCG/ACT NA SUSP: 2.0000 | Freq: Every day | NASAL | 3 refills | Status: AC

## 2023-12-13 MED ORDER — VENTOLIN HFA 108 (90 BASE) MCG/ACT IN AERS: 2.0000 | INHALATION_SPRAY | Freq: Four times a day (QID) | RESPIRATORY_TRACT | 3 refills | Status: AC | PRN

## 2023-12-13 NOTE — Progress Notes (Signed)
 Established Patient Office Visit  Subjective:  Patient ID: Leah Riley, female    DOB: December 05, 1955  Age: 68 y.o. MRN: [id number]  Chief Complaint  Patient presents with   Follow-up    1 week follow up    Patient comes in for follow-up today.  She has completed her round of prednisone  and antibiotics.  Her chest x-[place] was negative but showed chronic changes due to COPD.  She is now back to her mild shortness of breath and chronic cough due to COPD.  Currently she is on Chantix  but admits to smoking 1 cigarette every other day or so.  She continues to have nasal and sinus congestion.  She needs a refill on her Flonase nasal spray.  Advised not to use OTC decongestant nasal sprays as she gets rebound congestion. Her blood pressure is very high today as she forgot to take her medicine this morning, will take her blood pressure medicine as soon as she gets back home.    No other concerns at this time.   Past Medical History:  Diagnosis Date   COPD (chronic obstructive pulmonary disease) (HCC)    GERD (gastroesophageal reflux disease)    Headache    migraines   Hypertension     Past Surgical History:  Procedure Laterality Date   ABDOMINAL HYSTERECTOMY     APPENDECTOMY     BREAST CYST EXCISION Right    age 42   BREAST SURGERY Right    cyst removed   CYST EXCISION Left 07/28/2015   Procedure: CYST REMOVAL OF SUBCUTANEOUS LEFT KNEE;  Surgeon: Norleen JINNY Maltos, MD;  Location: [facility];  Service: Orthopedics;  Laterality: Left;   FOOT SURGERY Right    x3   KNEE ARTHROSCOPY Left    x2   OOPHORECTOMY     SHOULDER ARTHROSCOPY Right 05/26/2015   Procedure: SHOULDER RIGHT LIMITED ARTHROSCOPIC DEBRIDEMENT ARTHROSCOPIC SUBACROMIAL DECOMPRESSION MINI OPEN ROTATOR CUFF REPAIR MINI OPEN BICEPS TENODESIS;  Surgeon: Norleen JINNY Maltos, MD;  Location: [facility];  Service: Orthopedics;  Laterality: Right;   TUBAL LIGATION      Social History   Socioeconomic History   Marital  status: Divorced    Spouse name: Not on file   Number of children: Not on file   Years of education: Not on file   Highest education level: Not on file  Occupational History   Not on file  Tobacco Use   Smoking status: Every Day    Current packs/day: 1.50    Average packs/day: 1.5 packs/day for 35.0 years (52.5 ttl pk-yrs)    Types: Cigarettes   Smokeless tobacco: Never   Tobacco comments:    0.5 pack a day   Substance and Sexual Activity   Alcohol use: Yes    Comment: occas - couple times per year   Drug use: Not on file   Sexual activity: Not on file  Other Topics Concern   Not on file  Social History Narrative   Not on file   Social Drivers of Health   Tobacco Use: High Risk (12/13/2023)   Patient History    Smoking Tobacco Use: Every Day    Smokeless Tobacco Use: Never    Passive Exposure: Not on file  Financial Resource Strain: Medium Risk (12/19/2022)   Overall Financial Resource Strain (CARDIA)    Difficulty of Paying Living Expenses: Somewhat hard  Food Insecurity: No Food Insecurity (12/19/2022)   Hunger Vital Sign    Worried About Running Out  of Food in the Last Year: Never true    Ran Out of Food in the Last Year: Never true  Transportation Needs: No Transportation Needs (12/19/2022)   PRAPARE - Administrator, Civil Service (Medical): No    Lack of Transportation (Non-Medical): No  Physical Activity: Not on file  Stress: Stress Concern Present (12/19/2022)   [employer] of Occupational Health - Occupational Stress Questionnaire    Feeling of Stress : To some extent  Social Connections: Not on file  Intimate Partner Violence: Not At Risk (12/19/2022)   Humiliation, Afraid, Rape, and Kick questionnaire    Fear of Current or Ex-Partner: No    Emotionally Abused: No    Physically Abused: No    Sexually Abused: No  Depression (PHQ2-9): Medium Risk (12/19/2022)   Depression (PHQ2-9)    PHQ-2 Score: 8  Alcohol Screen: Low Risk  (12/19/2022)   Alcohol Screen    Last Alcohol Screening Score (AUDIT): 0  Housing: Unknown (12/19/2022)   Housing Stability Vital Sign    Unable to Pay for Housing in the Last Year: No    Number of Times Moved in the Last Year: Not on file    Homeless in the Last Year: No  Utilities: Not At Risk (12/15/2022)   [employer] Utilities    Threatened with loss of utilities: No  Health Literacy: Adequate Health Literacy (12/19/2022)   B1300 Health Literacy    Frequency of need for help with medical instructions: Never    Family History  Problem Relation Age of Onset   Coronary artery disease Mother    Coronary artery disease Father     Allergies[1]  Show/hide medication list[2]  Review of Systems  Constitutional: Negative.  Negative for chills, fever and malaise/fatigue.  HENT: Negative.  Negative for congestion and sore throat.   Eyes: Negative.  Negative for blurred vision and pain.  Respiratory:  Positive for cough (chronic). Negative for shortness of breath.   Cardiovascular: Negative.  Negative for chest pain, palpitations and leg swelling.  Gastrointestinal: Negative.  Negative for abdominal pain, blood in stool, constipation, diarrhea, heartburn, melena, nausea and vomiting.  Genitourinary: Negative.  Negative for dysuria, flank pain, frequency and urgency.  Musculoskeletal: Negative.  Negative for joint pain and myalgias.  Skin: Negative.   Neurological: Negative.  Negative for dizziness, tingling, sensory change, weakness and headaches.  Endo/Heme/Allergies: Negative.   Psychiatric/Behavioral: Negative.  Negative for depression and suicidal ideas. The patient is not nervous/anxious.        Objective:   BP (!) 154/88   Pulse 77   Ht 5' 2 (1.575 m)   Wt 112 lb 6.4 oz (51 kg)   SpO2 93%   BMI 20.56 kg/m   Vitals:   12/13/23 1145  BP: (!) 154/88  Pulse: 77  Height: 5' 2 (1.575 m)  Weight: 112 lb 6.4 oz (51 kg)  SpO2: 93%  BMI (Calculated): 20.55    Physical  Exam Vitals and nursing note reviewed.  Constitutional:      Appearance: Normal appearance.  HENT:     Head: Normocephalic and atraumatic.     Nose: Nose normal.     Mouth/Throat:     Mouth: Mucous membranes are moist.     Pharynx: Oropharynx is clear.  Eyes:     Conjunctiva/sclera: Conjunctivae normal.     Pupils: Pupils are equal, round, and reactive to light.  Cardiovascular:     Rate and Rhythm: Normal rate and regular rhythm.  Pulses: Normal pulses.     Heart sounds: Normal heart sounds. No murmur heard. Pulmonary:     Effort: Pulmonary effort is normal.     Breath sounds: Normal breath sounds. No wheezing, rhonchi or rales.  Abdominal:     General: Bowel sounds are normal.     Palpations: Abdomen is soft.     Tenderness: There is no abdominal tenderness. There is no right CVA tenderness or left CVA tenderness.  Musculoskeletal:        General: Normal range of motion.     Cervical back: Normal range of motion.     Right lower leg: No edema.     Left lower leg: No edema.  Skin:    General: Skin is warm and dry.  Neurological:     General: No focal deficit present.     Mental Status: She is alert and oriented to person, place, and time.  Psychiatric:        Mood and Affect: Mood normal.        Behavior: Behavior normal.      No results found for any visits on 12/13/23.  Recent Results (from the past 2160 hours)  Hemoglobin A1c     Status: None   Collection Time: 09/20/23 10:16 AM  Result Value Ref Range   Hgb A1c MFr Bld 5.6 4.8 - 5.6 %    Comment:          Prediabetes: 5.7 - 6.4          Diabetes: >6.4          Glycemic control for adults with diabetes: <7.0    Est. average glucose Bld gHb Est-mCnc 114 mg/dL  Lipid Panel w/o Chol/HDL Ratio     Status: None   Collection Time: 09/20/23 10:16 AM  Result Value Ref Range   Cholesterol, Total 136 100 - 199 mg/dL   Triglycerides 892 0 - 149 mg/dL   HDL 54 >60 mg/dL   VLDL Cholesterol Cal 19 5 - 40 mg/dL    LDL Chol Calc (NIH) 63 0 - 99 mg/dL  [id number]+ZHQM     Status: Abnormal   Collection Time: 09/20/23 10:16 AM  Result Value Ref Range   Glucose 94 70 - 99 mg/dL   BUN 17 8 - 27 mg/dL   Creatinine, Ser 8.93 (H) 0.57 - 1.00 mg/dL   eGFR 58 (L) >40 fO/fpw/8.26   BUN/Creatinine Ratio 16 12 - 28   Sodium 128 (L) 134 - 144 mmol/L   Potassium 5.3 (H) 3.5 - 5.2 mmol/L   Chloride 92 (L) 96 - 106 mmol/L   CO2 20 20 - 29 mmol/L   Calcium  9.6 8.7 - 10.3 mg/dL   Total Protein 7.1 6.0 - 8.5 g/dL   Albumin 4.6 3.9 - 4.9 g/dL   Globulin, Total 2.5 1.5 - 4.5 g/dL   Bilirubin Total 0.5 0.0 - 1.2 mg/dL   Alkaline Phosphatase 115 49 - 135 IU/L    Comment:               **Please note reference interval change**   AST 28 0 - 40 IU/L   ALT 15 0 - 32 IU/L  CBC with Diff     Status: Abnormal   Collection Time: 09/20/23 10:16 AM  Result Value Ref Range   WBC 11.0 (H) 3.4 - 10.8 x10E3/uL   RBC 3.79 3.77 - 5.28 x10E6/uL   Hemoglobin 12.3 11.1 - 15.9 g/dL   Hematocrit 62.1 65.9 - 46.6 %  MCV 100 (H) 79 - 97 fL   MCH 32.5 26.6 - 33.0 pg   MCHC 32.5 31.5 - 35.7 g/dL   RDW 86.7 88.2 - 84.5 %   Platelets 310 150 - 450 x10E3/uL   Neutrophils 64 Not Estab. %   Lymphs 25 Not Estab. %   Monocytes 6 Not Estab. %   Eos 4 Not Estab. %   Basos 1 Not Estab. %   Neutrophils Absolute 7.1 (H) 1.4 - 7.0 x10E3/uL   Lymphocytes Absolute 2.7 0.7 - 3.1 x10E3/uL   Monocytes Absolute 0.7 0.1 - 0.9 x10E3/uL   EOS (ABSOLUTE) 0.5 (H) 0.0 - 0.4 x10E3/uL   Basophils Absolute 0.1 0.0 - 0.2 x10E3/uL   Immature Granulocytes 0 Not Estab. %   Immature Grans (Abs) 0.0 0.0 - 0.1 x10E3/uL  CBC with Diff     Status: Abnormal   Collection Time: 10/11/23  9:45 AM  Result Value Ref Range   WBC 7.7 3.4 - 10.8 x10E3/uL   RBC 3.77 3.77 - 5.28 x10E6/uL   Hemoglobin 12.0 11.1 - 15.9 g/dL   Hematocrit 62.8 65.9 - 46.6 %   MCV 98 (H) 79 - 97 fL   MCH 31.8 26.6 - 33.0 pg   MCHC 32.3 31.5 - 35.7 g/dL   RDW 87.0 88.2 - 84.5 %   Platelets  321 150 - 450 x10E3/uL   Neutrophils 52 Not Estab. %   Lymphs 34 Not Estab. %   Monocytes 8 Not Estab. %   Eos 5 Not Estab. %   Basos 1 Not Estab. %   Neutrophils Absolute 4.0 1.4 - 7.0 x10E3/uL   Lymphocytes Absolute 2.6 0.7 - 3.1 x10E3/uL   Monocytes Absolute 0.6 0.1 - 0.9 x10E3/uL   EOS (ABSOLUTE) 0.4 0.0 - 0.4 x10E3/uL   Basophils Absolute 0.1 0.0 - 0.2 x10E3/uL   Immature Granulocytes 0 Not Estab. %   Immature Grans (Abs) 0.0 0.0 - 0.1 x10E3/uL  CMP14+EGFR     Status: Abnormal   Collection Time: 10/11/23  9:45 AM  Result Value Ref Range   Glucose 102 (H) 70 - 99 mg/dL   BUN 13 8 - 27 mg/dL   Creatinine, Ser 8.82 (H) 0.57 - 1.00 mg/dL   eGFR 51 (L) >40 fO/fpw/8.26   BUN/Creatinine Ratio 11 (L) 12 - 28   Sodium 137 134 - 144 mmol/L   Potassium 5.5 (H) 3.5 - 5.2 mmol/L   Chloride 100 96 - 106 mmol/L   CO2 22 20 - 29 mmol/L   Calcium  10.1 8.7 - 10.3 mg/dL   Total Protein 6.9 6.0 - 8.5 g/dL   Albumin 4.4 3.9 - 4.9 g/dL   Globulin, Total 2.5 1.5 - 4.5 g/dL   Bilirubin Total 0.6 0.0 - 1.2 mg/dL   Alkaline Phosphatase 114 49 - 135 IU/L   AST 30 0 - 40 IU/L   ALT 23 0 - 32 IU/L      Assessment & Plan:  Continue current medications.  Advised to stop smoking completely. Problem List Items Addressed This Visit       Cardiovascular and Mediastinum   Essential hypertension, benign - Primary     Respiratory   Seasonal allergic rhinitis due to pollen   Relevant Medications   fluticasone  (FLONASE) 50 MCG/ACT nasal spray   COPD with acute exacerbation (HCC)   Relevant Medications   VENTOLIN HFA 108 (90 Base) MCG/ACT inhaler   fluticasone  (FLONASE) 50 MCG/ACT nasal spray     Genitourinary   Stage  3a chronic kidney disease (HCC)     Other   Mixed hyperlipidemia    Return in about 2 months (around 02/13/2024).   Total time spent: 30 minutes. This time includes review of previous notes and results and patient face to face interaction during today's visit.    FERNAND FREDY RAMAN, MD  12/13/2023   This document may have been prepared by [facility] Voice Recognition software and as such may include unintentional dictation errors.     [1]  Allergies Allergen Reactions   Ace Inhibitors Cough   Gluten Meal Nausea And Vomiting    Itching, blistering   Sulfa Antibiotics Swelling    Rash, itching  [2]  Outpatient Medications Prior to Visit  Medication Sig   alendronate (FOSAMAX) 70 MG tablet Take 70 mg by mouth once a week.   amLODipine  (NORVASC ) 10 MG tablet Take 1 tablet (10 mg total) by mouth daily.   aspirin 81 MG tablet Take 81 mg by mouth daily.   atorvastatin  (LIPITOR) 20 MG tablet Take 1 tablet by mouth once daily   baclofen (LIORESAL) 10 MG tablet    buPROPion (WELLBUTRIN XL) 150 MG 24 hr tablet TAKE 1 TABLET BY MOUTH IN THE MORNING   Calcium  Carbonate-Vitamin D (CALTRATE 600+D PO) Take by mouth 2 (two) times daily.   estradiol (CLIMARA - DOSED IN MG/24 HR) 0.1 mg/24hr patch APPLY 1 PATCH TOPICALLY ONCE A WEEK   gabapentin (NEURONTIN) 300 MG capsule Take 1 capsule by mouth twice daily   meloxicam (MOBIC) 15 MG tablet Take 1 tablet by mouth once daily   mirtazapine (REMERON) 30 MG tablet Take 1 tablet by mouth once daily   montelukast  (SINGULAIR ) 10 MG tablet Take 1 tablet (10 mg total) by mouth daily.   multivitamin-iron-minerals-folic acid (CENTRUM) chewable tablet Chew 1 tablet by mouth daily.   omega-3 acid ethyl esters (LOVAZA) 1 g capsule Take by mouth.   Omega-3 Fatty Acids (FISH OIL) 1000 MG CAPS Fish Oil 1000 MG Oral Capsule QTY: 0 capsule Days: 0 Refills: 0  Written: 06/23/14 Patient Instructions:   pantoprazole  (PROTONIX ) 40 MG tablet Take 1 tablet by mouth once daily   solifenacin  (VESICARE ) 10 MG tablet Take 1 tablet by mouth once daily   TRELEGY ELLIPTA  100-62.5-25 MCG/ACT AEPB Inhale 1 puff into the lungs daily.   triamcinolone  cream (KENALOG ) 0.1 % Apply topically 2 (two) times daily. Use until eczema resolves then discontinue use.    varenicline  (CHANTIX ) 0.5 MG tablet Take 1 tablet (0.5 mg total) by mouth 2 (two) times daily.   VITAMIN E PO Take by mouth daily.   [DISCONTINUED] fluticasone  (FLONASE) 50 MCG/ACT nasal spray Place into both nostrils daily.   [DISCONTINUED] VENTOLIN HFA 108 (90 Base) MCG/ACT inhaler    amoxicillin -clavulanate (AUGMENTIN ) 875-125 MG tablet Take 1 tablet by mouth 2 (two) times daily. (Patient not taking: Reported on 12/13/2023)   [DISCONTINUED] predniSONE  (DELTASONE ) 20 MG tablet Take 2 tablets (40 mg total) by mouth daily with breakfast. (Patient not taking: Reported on 12/13/2023)   No facility-administered medications prior to visit.

## 2024-01-02 NOTE — Progress Notes (Signed)
 RONNETT PULLIN                                          MRN: [id number]   01/02/2024   The [facility] Quality Team Specialist reviewed this patient medical record for the purposes of chart review for care gap closure. The following were reviewed: chart review for care gap closure-controlling blood pressure.    [facility] Quality Team

## 2024-01-08 ENCOUNTER — Ambulatory Visit: Admitting: Internal Medicine

## 2024-01-08 ENCOUNTER — Encounter: Payer: Self-pay | Admitting: Internal Medicine

## 2024-01-08 VITALS — BP 134/80 | HR 77 | Ht 62.0 in | Wt 107.0 lb

## 2024-01-08 DIAGNOSIS — L309 Dermatitis, unspecified: Secondary | ICD-10-CM

## 2024-01-08 DIAGNOSIS — I10 Essential (primary) hypertension: Secondary | ICD-10-CM | POA: Diagnosis not present

## 2024-01-08 DIAGNOSIS — E782 Mixed hyperlipidemia: Secondary | ICD-10-CM

## 2024-01-08 DIAGNOSIS — D72828 Other elevated white blood cell count: Secondary | ICD-10-CM | POA: Diagnosis not present

## 2024-01-08 DIAGNOSIS — E871 Hypo-osmolality and hyponatremia: Secondary | ICD-10-CM | POA: Diagnosis not present

## 2024-01-08 MED ORDER — TACROLIMUS 0.1 % EX OINT: TOPICAL_OINTMENT | Freq: Two times a day (BID) | CUTANEOUS | 0 refills | Status: AC

## 2024-01-08 NOTE — Progress Notes (Signed)
 "  Established Patient Office Visit  Subjective:  Patient ID: Leah Riley, female    DOB: 24-Mar-1955  Age: 69 y.o. MRN: [id number]  Chief Complaint  Patient presents with   Rash    Eczema worsening on left hand    Patient is here today for follow up. She reports overall feeling fairly well but has concerns to discuss.  She reports her eczema has started getting worse on her palm left palm and she now has a spot on her right palm. Upon exam the skin is broken and there are fissures and plaque. There is erythema around the fissures. She reports it is painful and tender to the touch. There is also patchy areas on her extensor surfaces on both elbows. She endorses it is itchy. She reports keeping eczema moisturizer on her hands 4-5 times a day. She wears gloves when using cleaning products or doing the dishes. She has Kenalog  cream she was using twice a day but has run out. It initially improved with moisturizing and Kenalog  cream but has since gotten worse. She denies using frequent hand sanitizer or hand washing. Will start Tacrolimus  cream twice daily and send Dermatology referral.  Her oxygen is 91% today on room air. She reports she has not used her inhalers yet this morning but she has taken her BP medication. Her BP has improved since her last visit. She denies worsening shortness of breath and feels as she is at her baseline. She will use her inhaler when she returns home.  She is due for blood work and is fasting so will order to get completed today.  Rash Pertinent negatives include no congestion, cough, diarrhea, eye pain, fever, joint pain, shortness of breath, sore throat or vomiting.    No other concerns at this time.   Past Medical History:  Diagnosis Date   COPD (chronic obstructive pulmonary disease) (HCC)    GERD (gastroesophageal reflux disease)    Headache    migraines   Hypertension     Past Surgical History:  Procedure Laterality Date   ABDOMINAL HYSTERECTOMY      APPENDECTOMY     BREAST CYST EXCISION Right    age 63   BREAST SURGERY Right    cyst removed   CYST EXCISION Left 07/28/2015   Procedure: CYST REMOVAL OF SUBCUTANEOUS LEFT KNEE;  Surgeon: Norleen JINNY Maltos, Leah Riley;  Location: [facility];  Service: Orthopedics;  Laterality: Left;   FOOT SURGERY Right    x3   KNEE ARTHROSCOPY Left    x2   OOPHORECTOMY     SHOULDER ARTHROSCOPY Right 05/26/2015   Procedure: SHOULDER RIGHT LIMITED ARTHROSCOPIC DEBRIDEMENT ARTHROSCOPIC SUBACROMIAL DECOMPRESSION MINI OPEN ROTATOR CUFF REPAIR MINI OPEN BICEPS TENODESIS;  Surgeon: Norleen JINNY Maltos, Leah Riley;  Location: [facility];  Service: Orthopedics;  Laterality: Right;   TUBAL LIGATION      Social History   Socioeconomic History   Marital status: Divorced    Spouse name: Not on file   Number of children: Not on file   Years of education: Not on file   Highest education level: Not on file  Occupational History   Not on file  Tobacco Use   Smoking status: Every Day    Current packs/day: 1.50    Average packs/day: 1.5 packs/day for 35.0 years (52.5 ttl pk-yrs)    Types: Cigarettes   Smokeless tobacco: Never   Tobacco comments:    0.5 pack a day   Substance and Sexual Activity  Alcohol use: Yes    Comment: occas - couple times per year   Drug use: Not on file   Sexual activity: Not on file  Other Topics Concern   Not on file  Social History Narrative   Not on file   Social Drivers of Health   Tobacco Use: High Risk (01/08/2024)   Patient History    Smoking Tobacco Use: Every Day    Smokeless Tobacco Use: Never    Passive Exposure: Not on file  Financial Resource Strain: Medium Risk (12/19/2022)   Overall Financial Resource Strain (CARDIA)    Difficulty of Paying Living Expenses: Somewhat hard  Food Insecurity: No Food Insecurity (12/19/2022)   Hunger Vital Sign    Worried About Running Out of Food in the Last Year: Never true    Ran Out of Food in the Last Year: Never true   Transportation Needs: No Transportation Needs (12/19/2022)   PRAPARE - Administrator, Civil Service (Medical): No    Lack of Transportation (Non-Medical): No  Physical Activity: Not on file  Stress: Stress Concern Present (12/19/2022)   [employer] of Occupational Health - Occupational Stress Questionnaire    Feeling of Stress : To some extent  Social Connections: Not on file  Intimate Partner Violence: Not At Risk (12/19/2022)   Humiliation, Afraid, Rape, and Kick questionnaire    Fear of Current or Ex-Partner: No    Emotionally Abused: No    Physically Abused: No    Sexually Abused: No  Depression (PHQ2-9): Medium Risk (12/19/2022)   Depression (PHQ2-9)    PHQ-2 Score: 8  Alcohol Screen: Low Risk (12/19/2022)   Alcohol Screen    Last Alcohol Screening Score (AUDIT): 0  Housing: Unknown (12/19/2022)   Housing Stability Vital Sign    Unable to Pay for Housing in the Last Year: No    Number of Times Moved in the Last Year: Not on file    Homeless in the Last Year: No  Utilities: Not At Risk (12/15/2022)   [employer] Utilities    Threatened with loss of utilities: No  Health Literacy: Adequate Health Literacy (12/19/2022)   B1300 Health Literacy    Frequency of need for help with medical instructions: Never    Family History  Problem Relation Age of Onset   Coronary artery disease Mother    Coronary artery disease Father     Allergies[1]  Show/hide medication list[2]  Review of Systems  Constitutional: Negative.  Negative for chills, fever and malaise/fatigue.  HENT: Negative.  Negative for congestion and sore throat.   Eyes: Negative.  Negative for blurred vision and pain.  Respiratory: Negative.  Negative for cough and shortness of breath.   Cardiovascular: Negative.  Negative for chest pain, palpitations and leg swelling.  Gastrointestinal: Negative.  Negative for abdominal pain, blood in stool, constipation, diarrhea, heartburn, melena, nausea and  vomiting.  Genitourinary: Negative.  Negative for dysuria, flank pain, frequency and urgency.  Musculoskeletal: Negative.  Negative for joint pain and myalgias.  Skin:  Positive for rash.  Neurological: Negative.  Negative for dizziness, tingling, sensory change, weakness and headaches.  Endo/Heme/Allergies: Negative.   Psychiatric/Behavioral: Negative.  Negative for depression and suicidal ideas. The patient is not nervous/anxious.        Objective:   BP 134/80   Pulse 77   Ht 5' 2 (1.575 m)   Wt 107 lb (48.5 kg)   SpO2 91%   BMI 19.57 kg/m   Vitals:   01/08/24  0851  BP: 134/80  Pulse: 77  Height: 5' 2 (1.575 m)  Weight: 107 lb (48.5 kg)  SpO2: 91%  BMI (Calculated): 19.57    Physical Exam Vitals and nursing note reviewed.  Constitutional:      Appearance: Normal appearance.  HENT:     Head: Normocephalic and atraumatic.     Nose: Nose normal.     Mouth/Throat:     Mouth: Mucous membranes are moist.     Pharynx: Oropharynx is clear.  Eyes:     Conjunctiva/sclera: Conjunctivae normal.     Pupils: Pupils are equal, round, and reactive to light.  Cardiovascular:     Rate and Rhythm: Normal rate and regular rhythm.     Pulses: Normal pulses.     Heart sounds: Normal heart sounds. No murmur heard. Pulmonary:     Effort: Pulmonary effort is normal.     Breath sounds: Normal breath sounds. No wheezing.  Abdominal:     General: Bowel sounds are normal.     Palpations: Abdomen is soft.     Tenderness: There is no abdominal tenderness. There is no right CVA tenderness or left CVA tenderness.  Musculoskeletal:        General: Normal range of motion.     Cervical back: Normal range of motion.     Right lower leg: No edema.     Left lower leg: No edema.  Skin:    General: Skin is warm and dry.     Findings: Erythema and rash present. Rash is scaling (plaque).         Comments: Fissures on both right and left palmer surface  Neurological:     General: No focal  deficit present.     Mental Status: She is alert and oriented to person, place, and time.  Psychiatric:        Mood and Affect: Mood normal.        Behavior: Behavior normal.      No results found for any visits on 01/08/24.  Recent Results (from the past 2160 hours)  CBC with Diff     Status: Abnormal   Collection Time: 10/11/23  9:45 AM  Result Value Ref Range   WBC 7.7 3.4 - 10.8 x10E3/uL   RBC 3.77 3.77 - 5.28 x10E6/uL   Hemoglobin 12.0 11.1 - 15.9 g/dL   Hematocrit 62.8 65.9 - 46.6 %   MCV 98 (H) 79 - 97 fL   MCH 31.8 26.6 - 33.0 pg   MCHC 32.3 31.5 - 35.7 g/dL   RDW 87.0 88.2 - 84.5 %   Platelets 321 150 - 450 x10E3/uL   Neutrophils 52 Not Estab. %   Lymphs 34 Not Estab. %   Monocytes 8 Not Estab. %   Eos 5 Not Estab. %   Basos 1 Not Estab. %   Neutrophils Absolute 4.0 1.4 - 7.0 x10E3/uL   Lymphocytes Absolute 2.6 0.7 - 3.1 x10E3/uL   Monocytes Absolute 0.6 0.1 - 0.9 x10E3/uL   EOS (ABSOLUTE) 0.4 0.0 - 0.4 x10E3/uL   Basophils Absolute 0.1 0.0 - 0.2 x10E3/uL   Immature Granulocytes 0 Not Estab. %   Immature Grans (Abs) 0.0 0.0 - 0.1 x10E3/uL  CMP14+EGFR     Status: Abnormal   Collection Time: 10/11/23  9:45 AM  Result Value Ref Range   Glucose 102 (H) 70 - 99 mg/dL   BUN 13 8 - 27 mg/dL   Creatinine, Ser 8.82 (H) 0.57 - 1.00 mg/dL  eGFR 51 (L) >59 mL/min/1.73   BUN/Creatinine Ratio 11 (L) 12 - 28   Sodium 137 134 - 144 mmol/L   Potassium 5.5 (H) 3.5 - 5.2 mmol/L   Chloride 100 96 - 106 mmol/L   CO2 22 20 - 29 mmol/L   Calcium  10.1 8.7 - 10.3 mg/dL   Total Protein 6.9 6.0 - 8.5 g/dL   Albumin 4.4 3.9 - 4.9 g/dL   Globulin, Total 2.5 1.5 - 4.5 g/dL   Bilirubin Total 0.6 0.0 - 1.2 mg/dL   Alkaline Phosphatase 114 49 - 135 IU/L   AST 30 0 - 40 IU/L   ALT 23 0 - 32 IU/L      Assessment & Plan:  Start tacrolimus  ointment twice daily. Keep hands moisturized. Check routine blood work today and FU with patient on results. Send referral to dermatology at this  time. Suspect psoriasis> eczema. Problem List Items Addressed This Visit       Cardiovascular and Mediastinum   Essential hypertension, benign   Relevant Orders   CMP14+EGFR   CBC with Diff   Hemoglobin A1c     Musculoskeletal and Integument   Eczema - Primary   Relevant Medications   tacrolimus  (PROTOPIC ) 0.1 % ointment   Other Relevant Orders   Ambulatory referral to Dermatology     Other   Mixed hyperlipidemia   Relevant Orders   Lipid Panel w/o Chol/HDL Ratio   Leucocytosis   Hyponatremia    Return in about 10 days (around 01/18/2024).   Total time spent: 25 minutes. This time includes review of previous notes and results and patient face to face interaction during today's visit.    Leah FREDY RAMAN, Leah Riley  01/08/2024   This document may have been prepared by [facility] Voice Recognition software and as such may include unintentional dictation errors.     [1]  Allergies Allergen Reactions   Ace Inhibitors Cough   Gluten Meal Nausea And Vomiting    Itching, blistering   Sulfa Antibiotics Swelling    Rash, itching  [2]  Outpatient Medications Prior to Visit  Medication Sig   alendronate (FOSAMAX) 70 MG tablet Take 70 mg by mouth once a week.   amLODipine  (NORVASC ) 10 MG tablet Take 1 tablet (10 mg total) by mouth daily.   aspirin 81 MG tablet Take 81 mg by mouth daily.   atorvastatin  (LIPITOR) 20 MG tablet Take 1 tablet by mouth once daily   baclofen (LIORESAL) 10 MG tablet    buPROPion (WELLBUTRIN XL) 150 MG 24 hr tablet TAKE 1 TABLET BY MOUTH IN THE MORNING   Calcium  Carbonate-Vitamin D (CALTRATE 600+D PO) Take by mouth 2 (two) times daily.   estradiol (CLIMARA - DOSED IN MG/24 HR) 0.1 mg/24hr patch APPLY 1 PATCH TOPICALLY ONCE A WEEK   fluticasone  (FLONASE ) 50 MCG/ACT nasal spray Place 2 sprays into both nostrils daily.   gabapentin (NEURONTIN) 300 MG capsule Take 1 capsule by mouth twice daily   meloxicam (MOBIC) 15 MG tablet Take 1 tablet by mouth once daily    mirtazapine (REMERON) 30 MG tablet Take 1 tablet by mouth once daily   montelukast  (SINGULAIR ) 10 MG tablet Take 1 tablet (10 mg total) by mouth daily.   multivitamin-iron-minerals-folic acid (CENTRUM) chewable tablet Chew 1 tablet by mouth daily.   omega-3 acid ethyl esters (LOVAZA) 1 g capsule Take by mouth.   Omega-3 Fatty Acids (FISH OIL) 1000 MG CAPS Fish Oil 1000 MG Oral Capsule QTY: 0 capsule Days: 0 Refills:  0  Written: 06/23/14 Patient Instructions:   pantoprazole  (PROTONIX ) 40 MG tablet Take 1 tablet by mouth once daily   solifenacin  (VESICARE ) 10 MG tablet Take 1 tablet by mouth once daily   TRELEGY ELLIPTA  100-62.5-25 MCG/ACT AEPB Inhale 1 puff into the lungs daily.   triamcinolone  cream (KENALOG ) 0.1 % Apply topically 2 (two) times daily. Use until eczema resolves then discontinue use.   varenicline  (CHANTIX ) 0.5 MG tablet Take 1 tablet (0.5 mg total) by mouth 2 (two) times daily.   VENTOLIN  HFA 108 (90 Base) MCG/ACT inhaler Inhale 2 puffs into the lungs every 6 (six) hours as needed for wheezing or shortness of breath.   VITAMIN E PO Take by mouth daily.   [DISCONTINUED] amoxicillin -clavulanate (AUGMENTIN ) 875-125 MG tablet Take 1 tablet by mouth 2 (two) times daily. (Patient not taking: Reported on 01/08/2024)   No facility-administered medications prior to visit.   "

## 2024-01-09 ENCOUNTER — Ambulatory Visit: Payer: Self-pay | Admitting: Internal Medicine

## 2024-01-09 LAB — CBC WITH DIFFERENTIAL/PLATELET
Basophils Absolute: 0.1 x10E3/uL (ref 0.0–0.2)
Basos: 1 %
EOS (ABSOLUTE): 0.3 x10E3/uL (ref 0.0–0.4)
Eos: 4 %
Hematocrit: 36.6 % (ref 34.0–46.6)
Hemoglobin: 12.1 g/dL (ref 11.1–15.9)
Immature Grans (Abs): 0 x10E3/uL (ref 0.0–0.1)
Immature Granulocytes: 0 %
Lymphocytes Absolute: 2.3 x10E3/uL (ref 0.7–3.1)
Lymphs: 26 %
MCH: 31.3 pg (ref 26.6–33.0)
MCHC: 33.1 g/dL (ref 31.5–35.7)
MCV: 95 fL (ref 79–97)
Monocytes Absolute: 0.7 x10E3/uL (ref 0.1–0.9)
Monocytes: 8 %
Neutrophils Absolute: 5.5 x10E3/uL (ref 1.4–7.0)
Neutrophils: 61 %
Platelets: 285 x10E3/uL (ref 150–450)
RBC: 3.86 x10E6/uL (ref 3.77–5.28)
RDW: 13.1 % (ref 11.7–15.4)
WBC: 8.9 x10E3/uL (ref 3.4–10.8)

## 2024-01-09 LAB — HEMOGLOBIN A1C
Est. average glucose Bld gHb Est-mCnc: 117 mg/dL
Hgb A1c MFr Bld: 5.7 % — ABNORMAL HIGH (ref 4.8–5.6)

## 2024-01-09 LAB — CMP14+EGFR
ALT: 17 IU/L (ref 0–32)
AST: 21 IU/L (ref 0–40)
Albumin: 4.2 g/dL (ref 3.9–4.9)
Alkaline Phosphatase: 99 IU/L (ref 49–135)
BUN/Creatinine Ratio: 13 (ref 12–28)
BUN: 16 mg/dL (ref 8–27)
Bilirubin Total: 0.4 mg/dL (ref 0.0–1.2)
CO2: 22 mmol/L (ref 20–29)
Calcium: 9.9 mg/dL (ref 8.7–10.3)
Chloride: 98 mmol/L (ref 96–106)
Creatinine, Ser: 1.19 mg/dL — ABNORMAL HIGH (ref 0.57–1.00)
Globulin, Total: 2.3 g/dL (ref 1.5–4.5)
Glucose: 87 mg/dL (ref 70–99)
Potassium: 4.9 mmol/L (ref 3.5–5.2)
Sodium: 136 mmol/L (ref 134–144)
Total Protein: 6.5 g/dL (ref 6.0–8.5)
eGFR: 50 mL/min/1.73 — ABNORMAL LOW

## 2024-01-09 LAB — LIPID PANEL W/O CHOL/HDL RATIO
Cholesterol, Total: 153 mg/dL (ref 100–199)
HDL: 52 mg/dL
LDL Chol Calc (NIH): 82 mg/dL (ref 0–99)
Triglycerides: 102 mg/dL (ref 0–149)
VLDL Cholesterol Cal: 19 mg/dL (ref 5–40)

## 2024-01-21 ENCOUNTER — Ambulatory Visit

## 2024-01-22 ENCOUNTER — Ambulatory Visit: Admitting: Internal Medicine

## 2024-02-01 ENCOUNTER — Other Ambulatory Visit: Payer: Self-pay | Admitting: Internal Medicine

## 2024-02-14 ENCOUNTER — Ambulatory Visit: Admitting: Internal Medicine
